# Patient Record
Sex: Female | Born: 1950
Health system: Southern US, Community
[De-identification: ages and names within clinical notes are randomized; demographics above are authoritative.]

## PROBLEM LIST (undated history)

## (undated) DIAGNOSIS — J45909 Unspecified asthma, uncomplicated: Secondary | ICD-10-CM

## (undated) DIAGNOSIS — D72829 Elevated white blood cell count, unspecified: Secondary | ICD-10-CM

## (undated) DIAGNOSIS — K760 Fatty (change of) liver, not elsewhere classified: Secondary | ICD-10-CM

## (undated) DIAGNOSIS — T7840XA Allergy, unspecified, initial encounter: Secondary | ICD-10-CM

## (undated) DIAGNOSIS — K449 Diaphragmatic hernia without obstruction or gangrene: Secondary | ICD-10-CM

## (undated) DIAGNOSIS — K219 Gastro-esophageal reflux disease without esophagitis: Secondary | ICD-10-CM

## (undated) DIAGNOSIS — K5790 Diverticulosis of intestine, part unspecified, without perforation or abscess without bleeding: Secondary | ICD-10-CM

## (undated) DIAGNOSIS — Z8669 Personal history of other diseases of the nervous system and sense organs: Secondary | ICD-10-CM

## (undated) DIAGNOSIS — D649 Anemia, unspecified: Secondary | ICD-10-CM

## (undated) DIAGNOSIS — E785 Hyperlipidemia, unspecified: Secondary | ICD-10-CM

## (undated) DIAGNOSIS — K5900 Constipation, unspecified: Secondary | ICD-10-CM

## (undated) DIAGNOSIS — G43909 Migraine, unspecified, not intractable, without status migrainosus: Secondary | ICD-10-CM

## (undated) DIAGNOSIS — F419 Anxiety disorder, unspecified: Secondary | ICD-10-CM

## (undated) DIAGNOSIS — R5383 Other fatigue: Secondary | ICD-10-CM

## (undated) DIAGNOSIS — M791 Myalgia, unspecified site: Secondary | ICD-10-CM

## (undated) DIAGNOSIS — M1990 Unspecified osteoarthritis, unspecified site: Secondary | ICD-10-CM

## (undated) DIAGNOSIS — M329 Systemic lupus erythematosus, unspecified: Secondary | ICD-10-CM

## (undated) DIAGNOSIS — M317 Microscopic polyangiitis: Secondary | ICD-10-CM

## (undated) DIAGNOSIS — F329 Major depressive disorder, single episode, unspecified: Secondary | ICD-10-CM

## (undated) DIAGNOSIS — I7300 Raynaud's syndrome without gangrene: Secondary | ICD-10-CM

## (undated) DIAGNOSIS — D72820 Lymphocytosis (symptomatic): Secondary | ICD-10-CM

## (undated) DIAGNOSIS — K635 Polyp of colon: Secondary | ICD-10-CM

## (undated) DIAGNOSIS — R1310 Dysphagia, unspecified: Secondary | ICD-10-CM

## (undated) DIAGNOSIS — I10 Essential (primary) hypertension: Secondary | ICD-10-CM

## (undated) DIAGNOSIS — Z889 Allergy status to unspecified drugs, medicaments and biological substances status: Secondary | ICD-10-CM

## (undated) HISTORY — DX: Systemic lupus erythematosus, unspecified: M32.9

## (undated) HISTORY — DX: Essential (primary) hypertension: I10

## (undated) HISTORY — DX: Allergy status to unspecified drugs, medicaments and biological substances: Z88.9

## (undated) HISTORY — DX: Anemia, unspecified: D64.9

## (undated) HISTORY — DX: Myalgia, unspecified site: M79.10

## (undated) HISTORY — DX: Microscopic polyangiitis: M31.7

## (undated) HISTORY — DX: Gastro-esophageal reflux disease without esophagitis: K21.9

## (undated) HISTORY — DX: Allergy, unspecified, initial encounter: T78.40XA

## (undated) HISTORY — DX: Fatty (change of) liver, not elsewhere classified: K76.0

## (undated) HISTORY — DX: Constipation, unspecified: K59.00

## (undated) HISTORY — DX: Lymphocytosis (symptomatic): D72.820

## (undated) HISTORY — DX: Diverticulosis of intestine, part unspecified, without perforation or abscess without bleeding: K57.90

## (undated) HISTORY — DX: Raynaud's syndrome without gangrene: I73.00

## (undated) HISTORY — DX: Major depressive disorder, single episode, unspecified: F32.9

## (undated) HISTORY — DX: Unspecified asthma, uncomplicated: J45.909

## (undated) HISTORY — DX: Diaphragmatic hernia without obstruction or gangrene: K44.9

## (undated) HISTORY — DX: Elevated white blood cell count, unspecified: D72.829

## (undated) HISTORY — PX: LAPAROSCOPIC ABDOMINAL EXPLORATION: SHX6249

## (undated) HISTORY — DX: Other fatigue: R53.83

## (undated) HISTORY — DX: Polyp of colon: K63.5

## (undated) HISTORY — DX: Anxiety disorder, unspecified: F41.9

## (undated) HISTORY — DX: Dysphagia, unspecified: R13.10

## (undated) HISTORY — DX: Personal history of other diseases of the nervous system and sense organs: Z86.69

## (undated) HISTORY — DX: Migraine, unspecified, not intractable, without status migrainosus: G43.909

## (undated) HISTORY — DX: Unspecified osteoarthritis, unspecified site: M19.90

## (undated) HISTORY — DX: Hyperlipidemia, unspecified: E78.5

---
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
## 1987-07-03 HISTORY — PX: APPENDECTOMY: SHX54

## 1987-07-03 HISTORY — PX: ABDOMINAL HYSTERECTOMY: SHX81

## 2012-07-02 HISTORY — PX: COLONOSCOPY: SHX174

## 2013-09-30 DIAGNOSIS — Z8669 Personal history of other diseases of the nervous system and sense organs: Secondary | ICD-10-CM

## 2013-09-30 HISTORY — DX: Personal history of other diseases of the nervous system and sense organs: Z86.69

## 2015-06-15 DIAGNOSIS — M5416 Radiculopathy, lumbar region: Secondary | ICD-10-CM | POA: Insufficient documentation

## 2015-09-27 DIAGNOSIS — L930 Discoid lupus erythematosus: Secondary | ICD-10-CM | POA: Insufficient documentation

## 2015-09-27 DIAGNOSIS — K760 Fatty (change of) liver, not elsewhere classified: Secondary | ICD-10-CM | POA: Insufficient documentation

## 2015-09-27 DIAGNOSIS — G43909 Migraine, unspecified, not intractable, without status migrainosus: Secondary | ICD-10-CM

## 2015-09-27 DIAGNOSIS — K449 Diaphragmatic hernia without obstruction or gangrene: Secondary | ICD-10-CM | POA: Insufficient documentation

## 2015-09-27 HISTORY — DX: Migraine, unspecified, not intractable, without status migrainosus: G43.909

## 2016-07-19 LAB — POCT ERYTHROCYTE SEDIMENTATION RATE, NON-AUTOMATED: SED RATE: 9

## 2016-09-19 ENCOUNTER — Ambulatory Visit: Payer: Self-pay | Admitting: Family Medicine

## 2016-09-25 ENCOUNTER — Encounter: Payer: Self-pay | Admitting: *Deleted

## 2016-09-25 ENCOUNTER — Other Ambulatory Visit: Payer: Self-pay | Admitting: *Deleted

## 2016-10-02 ENCOUNTER — Encounter: Payer: Self-pay | Admitting: Family Medicine

## 2016-10-02 ENCOUNTER — Ambulatory Visit (INDEPENDENT_AMBULATORY_CARE_PROVIDER_SITE_OTHER): Payer: BLUE CROSS/BLUE SHIELD | Admitting: Family Medicine

## 2016-10-02 VITALS — BP 140/87 | HR 79 | Temp 98.5°F | Resp 20 | Ht 62.0 in | Wt 142.2 lb

## 2016-10-02 DIAGNOSIS — I7300 Raynaud's syndrome without gangrene: Secondary | ICD-10-CM | POA: Insufficient documentation

## 2016-10-02 DIAGNOSIS — M329 Systemic lupus erythematosus, unspecified: Secondary | ICD-10-CM

## 2016-10-02 DIAGNOSIS — I10 Essential (primary) hypertension: Secondary | ICD-10-CM | POA: Diagnosis not present

## 2016-10-02 DIAGNOSIS — F332 Major depressive disorder, recurrent severe without psychotic features: Secondary | ICD-10-CM

## 2016-10-02 DIAGNOSIS — M317 Microscopic polyangiitis: Secondary | ICD-10-CM

## 2016-10-02 DIAGNOSIS — F329 Major depressive disorder, single episode, unspecified: Secondary | ICD-10-CM | POA: Insufficient documentation

## 2016-10-02 DIAGNOSIS — F419 Anxiety disorder, unspecified: Secondary | ICD-10-CM | POA: Insufficient documentation

## 2016-10-02 DIAGNOSIS — E785 Hyperlipidemia, unspecified: Secondary | ICD-10-CM | POA: Insufficient documentation

## 2016-10-02 MED ORDER — LISINOPRIL 10 MG PO TABS: 10.0000 mg | ORAL_TABLET | Freq: Every day | ORAL | 1 refills | Status: DC

## 2016-10-02 MED ORDER — TRAZODONE HCL 50 MG PO TABS: 50.0000 mg | ORAL_TABLET | Freq: Every evening | ORAL | 0 refills | Status: DC | PRN

## 2016-10-02 MED ORDER — ESCITALOPRAM OXALATE 10 MG PO TABS: 10.0000 mg | ORAL_TABLET | Freq: Every day | ORAL | 0 refills | Status: DC

## 2016-10-02 NOTE — Patient Instructions (Signed)
It was great to meet you today.  Try to jon the grief counseling group. Take this one day at a time. If you need anything please do not hesitate to make an appt.  Start the trazodone 1 pill about 1 hour before bed for a week. If needed only can go to two pills before bed.  Continue lexapro.  F/U 4 weeks.    Routine follow ups every 6 months on hypertension.

## 2016-10-02 NOTE — Progress Notes (Signed)
Patient ID: Monica Schwartz, female  DOB: 08-May-1951, 66 y.o.   MRN: [id number] Patient Care Team    Relationship Specialty Notifications Start End  Natalia Leatherwood, DO PCP - General Family Medicine  10/02/16   Francee Gentile, MD  Internal Medicine  10/02/16    Comment: rheumatologist- SLE    Subjective:  Monica Schwartz is a 66 y.o.  female present for new patient establishment. All past medical history, surgical history, allergies, family history, immunizations, medications and social history were obtained in the electronic medical record today. All recent labs, ED visits and hospitalizations within the last year were reviewed.  Hypertension: Patient reports compliance with lisinopril 10 mg daily. She denies chest pain, shortness of breath, lower extremity edema or dizziness. She reports normal blood pressures recorded at home. She is in need of refills today. Her lab work is up-to-date and completed every 3 months at her rheumatologist office.  Depression/anxiety: She has been prescribed Lexapro 10 mg daily for quite a few years. She does well typically on this medication. She has recently lost her husband approximately one month ago and is having difficulty adjusting. She is feeling overwhelmed with all the responsibilities and taking care of arrangements since his death. She has contacted hospice and they are guiding her on grief counseling. She has not started grief counseling as of yet but feels she may be ready in a couple weeks to start. She is having difficulty sleeping. She states sometimes she has trouble falling asleep, sometimes she falls sleep but continues to wake up through the night. She has a great support system with her best friend and neighbor. She has one living son that lives in [place] currently.  Systemic lupus erythematous with microscopic polyangiitis angiitis. Patient follows every 3 months with Dr. Herma Carson. She is prescribed tramadol, prednisone 5 mg, methotrexate,  plaque with no Folic acid through her rheumatologist. She has routine laboratory completed every 3 months through that office as well.  No flowsheet data found.    Immunization History  Administered Date(s) Administered  . Influenza Split 04/05/2016  . Tdap 01/25/2014     Past Medical History:  Diagnosis Date  . Allergy   . Anemia   . Anxiety   . Asthma   . Colon polyp    benign  . Constipation   . Diverticulosis   . Dysphagia   . Fatigue   . Fatty liver   . GERD (gastroesophageal reflux disease)   . H/O seasonal allergies   . Hiatal hernia   . History of tremor 09/2013  . Hyperlipidemia   . Hypertension   . Leucocytosis   . Lymphocytosis   . Major depressive disorder    with anxiety  . Microscopic polyangiitis (HCC)   . Migraine   . Myalgia   . Odynophagia   . Osteoarthritis   . Raynauds syndrome   . Systemic lupus (HCC)    + ANA/DS-DNA, anti PR-3ab (neg C-ANCA), anti- cardiolipin IGM. Chronic leukocytosis and mild eosinophilia.    Allergies  Allergen Reactions  . Penicillins Shortness Of Breath and Rash  . Seroquel [Quetiapine Fumarate] Other (See Comments)    Tremor   Past Surgical History:  Procedure Laterality Date  . ABDOMINAL HYSTERECTOMY  1989   with removal of ovaries  . APPENDECTOMY  1989  . CESAREAN SECTION  1984  . LAPAROSCOPIC ABDOMINAL EXPLORATION     x2 for endometrosis 1980 and 1984  . TONSILLECTOMY AND ADENOIDECTOMY  1970  Family History  Problem Relation Age of Onset  . Stroke Mother   . Arthritis Mother   . Depression Mother   . Alcohol abuse Father   . COPD Father   . Hearing loss Father   . Arthritis Brother   . Depression Brother   . Parkinson's disease Brother   . Depression Son   . Breast cancer Paternal Aunt   . Arthritis Paternal Aunt    Social History   Social History  . Marital status: Widowed    Spouse name: N/A  . Number of children: 1  . Years of education: 34   Occupational History  . retired     Social History Main Topics  . Smoking status: Former Smoker    Packs/day: 0.50    Years: 10.00  . Smokeless tobacco: Never Used  . Alcohol use Yes     Comment: occasional  . Drug use: No  . Sexual activity: No   Other Topics Concern  . Not on file   Social History Narrative   Recently widowed March 2018. Has 2 children, one living named [place].   She is retired Sales promotion account executive.   She drinks caffeine. Takes a daily vitamin.   Wears her seatbelt, wears a bicycle helmet.   Routinely exercises.   Smoke detector in the home.   Feels safe in her relationships.   Allergies as of 10/02/2016      Reactions   Penicillins Shortness Of Breath, Rash   Seroquel [quetiapine Fumarate] Other (See Comments)   Tremor      Medication List       Accurate as of 10/02/16  5:24 PM. Always use your most recent med list.          aspirin EC 81 MG tablet Take 81 mg by mouth.   CLARITIN-D 24 HOUR 10-240 MG 24 hr tablet Generic drug:  loratadine-pseudoephedrine Take 1 tablet by mouth daily.   escitalopram 10 MG tablet Commonly known as:  LEXAPRO Take 1 tablet (10 mg total) by mouth daily.   folic acid 1 MG tablet Commonly known as:  FOLVITE Take 1 tablet by mouth daily.   hydroxychloroquine 200 MG tablet Commonly known as:  PLAQUENIL Take 1.5 tablets by mouth daily.   lisinopril 10 MG tablet Commonly known as:  PRINIVIL,ZESTRIL Take 1 tablet (10 mg total) by mouth daily. for blood pressure   methotrexate (PF) 50 MG/2ML injection INJECT 0.5 ML ONCE WEEKLY.   predniSONE 5 MG tablet Commonly known as:  DELTASONE Take 5 mg by mouth daily.   PROAIR HFA IN Inhale into the lungs.   traMADol 50 MG tablet Commonly known as:  ULTRAM Take 50 mg by mouth 3 (three) times daily as needed.   traZODone 50 MG tablet Commonly known as:  DESYREL Take 1-2 tablets (50-100 mg total) by mouth at bedtime as needed for sleep.        No results found for this or any  previous visit (from the past 2160 hour(s)).  Patient was never admitted.   ROS: 14 pt review of systems performed and negative (unless mentioned in an HPI)  Objective: BP 140/87 (BP Location: Left Arm, Patient Position: Sitting, Cuff Size: Normal)   Pulse 79   Temp 98.5 F (36.9 C)   Resp 20   Ht  (1.575 m)   Wt 142 lb 4 oz (64.5 kg)   SpO2 98%   BMI 26.02 kg/m  Gen: Afebrile. No acute distress. Nontoxic in appearance,  well-developed, well-nourished,  very pleasant Caucasian female. HENT: AT. [place]. MMM. Eyes:Pupils Equal Round Reactive to light, Extraocular movements intact,  Conjunctiva without redness, discharge or icterus. Neck/lymp/endocrine: Supple, no lymphadenopathy, no thyromegaly CV: RRR no murmur appreciated, no edema, +2/4 P posterior tibialis pulses. No carotid bruits. No JVD. Chest: CTAB, no wheeze, rhonchi or crackles. Normal Respiratory effort. Good Air movement. Abd: Soft. NTND. BS present. No Masses palpated. No hepatosplenomegaly. No rebound tenderness or guarding. Skin: Warm and well-perfused. Skin intact. Neuro/Msk:Normal gait. PERLA. EOMi. Alert. Oriented x3.   Psych: Tearful, sad. Grieving. Otherwise , Normal affect, dress and demeanor. Normal speech. Normal thought content and judgment. No SI or HI   Assessment/plan: Monica Schwartz is a 66 y.o. female present for establishment of care, worsening depression and chronic medical issues. Essential hypertension - Stable today. - Refills of lisinopril 10 mg daily provided. - Low-sodium diet, routine exercise encouraged. - lisinopril (PRINIVIL,ZESTRIL) 10 MG tablet; Take 1 tablet (10 mg total) by mouth daily. for blood pressure  Dispense: 90 tablet; Refill: 1 - Follow-up in 6 months  Microscopic polyangiitis (HCC)/Systemic lupus erythematosus, unspecified SLE type, unspecified organ involvement status (HCC) - Continue routine follow-up with Dr. Herma Carson every 3 months. Patient will have copy of lab results and to  our office every 3 months.  Severe episode of recurrent major depressive disorder, without psychotic features (HCC) - Worsening. Recently widowed. - Patient appears to have good support system in place. She was encouraged to consider starting grief counseling. -Discussed options for treatment today, decided on starting trazodone 50 mg daily at bedtime for 1 week. If needed only can increase to 100 mg daily at bedtime. Discussed and encouraged good sleep hygiene. - escitalopram (LEXAPRO) 10 MG tablet; Take 1 tablet (10 mg total) by mouth daily.  Dispense: 90 tablet; Refill: 0 - traZODone (DESYREL) 50 MG tablet; Take 1-2 tablets (50-100 mg total) by mouth at bedtime as needed for sleep.  Dispense: 60 tablet; Refill: 0 - Patient to follow-up in 4 weeks   Return in about 4 weeks (around 10/30/2016), or depression.  Greater than 45 minutes was spent with patient, greater than 50% of that time was spent face-to-face with patient counseling and/or coordinating care.    Electronically signed by: Felix Pacini, DO [facility] [place]

## 2016-10-23 ENCOUNTER — Encounter: Payer: Self-pay | Admitting: Family Medicine

## 2016-10-23 ENCOUNTER — Ambulatory Visit (INDEPENDENT_AMBULATORY_CARE_PROVIDER_SITE_OTHER): Payer: BLUE CROSS/BLUE SHIELD | Admitting: Family Medicine

## 2016-10-23 VITALS — BP 126/80 | HR 71 | Temp 98.4°F | Resp 20 | Ht 62.0 in | Wt 141.8 lb

## 2016-10-23 DIAGNOSIS — F332 Major depressive disorder, recurrent severe without psychotic features: Secondary | ICD-10-CM

## 2016-10-23 DIAGNOSIS — F419 Anxiety disorder, unspecified: Secondary | ICD-10-CM

## 2016-10-23 MED ORDER — ESCITALOPRAM OXALATE 20 MG PO TABS: 20.0000 mg | ORAL_TABLET | Freq: Every day | ORAL | 0 refills | Status: DC

## 2016-10-23 MED ORDER — TRAZODONE HCL 50 MG PO TABS: 50.0000 mg | ORAL_TABLET | Freq: Every evening | ORAL | 1 refills | Status: DC | PRN

## 2016-10-23 NOTE — Patient Instructions (Addendum)
It was nice to see you again today.  You look great today. I am glad you doing better. Have fun visiting your son.  Continue the trazodone at night 2 tabs is ok, I have called this in for you.  Continue the lexapro. Start taking 20 mg total (2 tabs of what you have now) the refills will be lexapro 20 mg, so then only take 1 tab. Remember 20 mg total.   Follow up in 5 months for both Hypertension and depression.

## 2016-10-23 NOTE — Progress Notes (Signed)
Patient ID: Monica Schwartz, female  DOB: 03/09/1951, 66 y.o.   MRN: [id number] Patient Care Team    Relationship Specialty Notifications Start End  Natalia Leatherwood, DO PCP - General Family Medicine  10/02/16   Francee Gentile, MD  Internal Medicine  10/02/16    Comment: rheumatologist- SLE   Chief Complaint  Patient presents with  . Depression    Subjective:  Monica Schwartz is a 66 y.o.  female present for follow up depression/anxiety.  Depression/anxiety:  Pt presents today for follow up on depression  after starting trazodone 50 (1-2 tabs) mg Qhs and lexapro 10 mg QD, 4 weeks ago. Patient recently lost her husband suddenly and had a difficult time coping with the loss (see prior note below for details). Pateint reports compliance with lexapro and trazodone 100 mg. Since starting the medicine she feels improved. She has been able to sleep and feels better during the day. She does feel she could use the higher dose of lexapro. She reports no side effects. She is flying out to her son in [place] this week  And is excited to see him.   Prior note 10/02/2016:  She has been prescribed Lexapro 10 mg daily for quite a few years. She does well typically on this medication. She has recently lost her husband approximately one month ago and is having difficulty adjusting. She is feeling overwhelmed with all the responsibilities and taking care of arrangements since his death. She has contacted hospice and they are guiding her on grief counseling. She has not started grief counseling as of yet but feels she may be ready in a couple weeks to start. She is having difficulty sleeping. She states sometimes she has trouble falling asleep, sometimes she falls sleep but continues to wake up through the night. She has a great support system with her best friend and neighbor. She has one living son that lives in [place] currently.   Depression screen [facility] 2/9 10/23/2016  Decreased Interest 3  Down,  Depressed, Hopeless 3  PHQ - 2 Score 6  Altered sleeping 0  Tired, decreased energy 0  Change in appetite 3  Feeling bad or failure about yourself  0  Trouble concentrating 1  Moving slowly or fidgety/restless 0  Suicidal thoughts 0  PHQ-9 Score 10  Difficult doing work/chores Somewhat difficult      Immunization History  Administered Date(s) Administered  . Influenza Split 04/05/2016  . Tdap 01/25/2014     Past Medical History:  Diagnosis Date  . Allergy   . Anemia   . Anxiety   . Asthma   . Colon polyp    benign  . Constipation   . Diverticulosis   . Dysphagia   . Fatigue   . Fatty liver   . GERD (gastroesophageal reflux disease)   . H/O seasonal allergies   . Hiatal hernia   . History of tremor 09/2013  . Hyperlipidemia   . Hypertension   . Leucocytosis   . Lymphocytosis   . Major depressive disorder    with anxiety  . Microscopic polyangiitis (HCC)   . Migraine   . Myalgia   . Odynophagia   . Osteoarthritis   . Raynauds syndrome   . Systemic lupus (HCC)    + ANA/DS-DNA, anti PR-3ab (neg C-ANCA), anti- cardiolipin IGM. Chronic leukocytosis and mild eosinophilia.    Allergies  Allergen Reactions  . Penicillins Shortness Of Breath and Rash  . Seroquel [Quetiapine Fumarate]  Other (See Comments)    Tremor   Past Surgical History:  Procedure Laterality Date  . ABDOMINAL HYSTERECTOMY  1989   with removal of ovaries  . APPENDECTOMY  1989  . CESAREAN SECTION  1984  . LAPAROSCOPIC ABDOMINAL EXPLORATION     x2 for endometrosis 1980 and 1984  . TONSILLECTOMY AND ADENOIDECTOMY  1970   Family History  Problem Relation Age of Onset  . Stroke Mother   . Arthritis Mother   . Depression Mother   . Alcohol abuse Father   . COPD Father   . Hearing loss Father   . Arthritis Brother   . Depression Brother   . Parkinson's disease Brother   . Depression Son   . Breast cancer Paternal Aunt   . Arthritis Paternal Aunt    Social History   Social History    . Marital status: Widowed    Spouse name: N/A  . Number of children: 1  . Years of education: 57   Occupational History  . retired    Social History Main Topics  . Smoking status: Former Smoker    Packs/day: 0.50    Years: 10.00  . Smokeless tobacco: Never Used  . Alcohol use Yes     Comment: occasional  . Drug use: No  . Sexual activity: No   Other Topics Concern  . Not on file   Social History Narrative   Recently widowed March 2018. Has 2 children, one living named [place].   She is retired Sales promotion account executive.   She drinks caffeine. Takes a daily vitamin.   Wears her seatbelt, wears a bicycle helmet.   Routinely exercises.   Smoke detector in the home.   Feels safe in her relationships.   Allergies as of 10/23/2016      Reactions   Penicillins Shortness Of Breath, Rash   Seroquel [quetiapine Fumarate] Other (See Comments)   Tremor      Medication List       Accurate as of 10/23/16 11:37 AM. Always use your most recent med list.          aspirin EC 81 MG tablet Take 81 mg by mouth.   CLARITIN-D 24 HOUR 10-240 MG 24 hr tablet Generic drug:  loratadine-pseudoephedrine Take 1 tablet by mouth daily.   escitalopram 10 MG tablet Commonly known as:  LEXAPRO Take 1 tablet (10 mg total) by mouth daily.   folic acid 1 MG tablet Commonly known as:  FOLVITE Take 1 tablet by mouth daily.   hydroxychloroquine 200 MG tablet Commonly known as:  PLAQUENIL Take 1.5 tablets by mouth daily.   lisinopril 10 MG tablet Commonly known as:  PRINIVIL,ZESTRIL Take 1 tablet (10 mg total) by mouth daily. for blood pressure   methotrexate (PF) 50 MG/2ML injection INJECT 0.5 ML ONCE WEEKLY.   predniSONE 5 MG tablet Commonly known as:  DELTASONE Take 5 mg by mouth daily.   PROAIR HFA IN Inhale into the lungs.   traMADol 50 MG tablet Commonly known as:  ULTRAM Take 50 mg by mouth 3 (three) times daily as needed.   traZODone 50 MG tablet Commonly known  as:  DESYREL Take 1-2 tablets (50-100 mg total) by mouth at bedtime as needed for sleep.        No results found for this or any previous visit (from the past 2160 hour(s)).  Patient was never admitted.   ROS: 14 pt review of systems performed and negative (unless mentioned in an HPI)  Objective: BP 126/80 (BP Location: Right Arm, Patient Position: Sitting, Cuff Size: Normal)   Pulse 71   Temp 98.4 F (36.9 C)   Resp 20   Ht  (1.575 m)   Wt 141 lb 12.8 oz (64.3 kg)   SpO2 99%   BMI 25.94 kg/m   Gen: Afebrile. No acute distress.  HENT: AT. [place].  MMM.  Neuro: Normal gait. PERLA. EOMi. Alert. Oriented.  Psych: Normal affect, dress and demeanor. Normal speech. Normal thought content and judgment. Looks well today.    Assessment/plan: Monica Schwartz is a 66 y.o. female present for follow up on depression.  Severe episode of recurrent major depressive disorder, without psychotic features (HCC) - pt appears much improved today.  - continue trazodone 100 mg and increase lexapro to 20 mg Qd. Refills provided for each.  - Patient appears to have good support system in place. She is excited to go visit her son.  - She was encouraged to consider starting grief counseling. - F/U 5 mos with chronic medical conditions (HTN) follow up, sooner if needed.    Return in about 5 months (around 03/25/2017) for HTN/depression .   Electronically signed by: Felix Pacini, DO [facility] [place]

## 2017-01-21 ENCOUNTER — Other Ambulatory Visit: Payer: Self-pay | Admitting: *Deleted

## 2017-01-21 DIAGNOSIS — F332 Major depressive disorder, recurrent severe without psychotic features: Secondary | ICD-10-CM

## 2017-01-21 DIAGNOSIS — F419 Anxiety disorder, unspecified: Secondary | ICD-10-CM

## 2017-01-21 MED ORDER — ESCITALOPRAM OXALATE 20 MG PO TABS: 20.0000 mg | ORAL_TABLET | Freq: Every day | ORAL | 0 refills | Status: DC

## 2017-01-22 LAB — BASIC METABOLIC PANEL
BUN: 11 (ref 4–21)
Creatinine: 0.7 (ref 0.5–1.1)
Glucose: 84
Potassium: 4.2 (ref 3.4–5.3)
SODIUM: 140 (ref 137–147)

## 2017-01-22 LAB — HEPATIC FUNCTION PANEL
ALK PHOS: 66 (ref 25–125)
ALT: 20 (ref 7–35)
AST: 20 (ref 13–35)
BILIRUBIN, TOTAL: 0.3

## 2017-01-22 LAB — CBC AND DIFFERENTIAL
HCT: 36 (ref 36–46)
Hemoglobin: 12.5 (ref 12.0–16.0)
NEUTROS ABS: 6
Platelets: 296 (ref 150–399)
WBC: 10.1

## 2017-03-22 ENCOUNTER — Ambulatory Visit (INDEPENDENT_AMBULATORY_CARE_PROVIDER_SITE_OTHER): Payer: BLUE CROSS/BLUE SHIELD | Admitting: Family Medicine

## 2017-03-22 ENCOUNTER — Encounter: Payer: Self-pay | Admitting: Family Medicine

## 2017-03-22 VITALS — BP 106/74 | HR 86 | Temp 98.6°F | Resp 20 | Ht 62.0 in | Wt 134.5 lb

## 2017-03-22 DIAGNOSIS — M317 Microscopic polyangiitis: Secondary | ICD-10-CM | POA: Diagnosis not present

## 2017-03-22 DIAGNOSIS — M329 Systemic lupus erythematosus, unspecified: Secondary | ICD-10-CM

## 2017-03-22 DIAGNOSIS — Z23 Encounter for immunization: Secondary | ICD-10-CM

## 2017-03-22 DIAGNOSIS — F419 Anxiety disorder, unspecified: Secondary | ICD-10-CM | POA: Diagnosis not present

## 2017-03-22 DIAGNOSIS — F332 Major depressive disorder, recurrent severe without psychotic features: Secondary | ICD-10-CM

## 2017-03-22 DIAGNOSIS — I10 Essential (primary) hypertension: Secondary | ICD-10-CM | POA: Diagnosis not present

## 2017-03-22 MED ORDER — LISINOPRIL 10 MG PO TABS: 10.0000 mg | ORAL_TABLET | Freq: Every day | ORAL | 1 refills | Status: DC

## 2017-03-22 MED ORDER — ESCITALOPRAM OXALATE 20 MG PO TABS: 20.0000 mg | ORAL_TABLET | Freq: Every day | ORAL | 1 refills | Status: DC

## 2017-03-22 MED ORDER — TRAZODONE HCL 100 MG PO TABS: 100.0000 mg | ORAL_TABLET | Freq: Every evening | ORAL | 1 refills | Status: DC | PRN

## 2017-03-22 NOTE — Progress Notes (Signed)
Patient ID: Monica Schwartz, female  DOB: 06/18/1951, 67 y.o.   MRN: [id number] Patient Care Team    Relationship Specialty Notifications Start End  Natalia Leatherwood, DO PCP - General Family Medicine  10/02/16   Francee Gentile, MD  Internal Medicine  10/02/16    Comment: rheumatologist- SLE   Chief Complaint  Patient presents with  . Hypertension  . Depression    Subjective:  Monica Schwartz is a 66 y.o.  female present for follow up depression/anxiety.  Depression/anxiety:  Pt reports she is doing well on the lexapro 20 mg and Trazodone 100 mg QD. She is sleeping well and feels she is dealing well her grief and depression. She had a wonderful time in [place] visiting her son. She is planning a trip to [place] around the holidays to spend time with her son and his girlfriend.    Initial note 10/02/2016:  She has been prescribed Lexapro 10 mg daily for quite a few years. She does well typically on this medication. She has recently lost her husband approximately one month ago and is having difficulty adjusting. She is feeling overwhelmed with all the responsibilities and taking care of arrangements since his death. She has contacted hospice and they are guiding her on grief counseling. She has not started grief counseling as of yet but feels she may be ready in a couple weeks to start. She is having difficulty sleeping. She states sometimes she has trouble falling asleep, sometimes she falls sleep but continues to wake up through the night. She has a great support system with her best friend and neighbor. She has one living son that lives in [place] currently.   Depression screen [facility] 2/9 03/22/2017 10/23/2016  Decreased Interest 0 3  Down, Depressed, Hopeless 1 3  PHQ - 2 Score 1 6  Altered sleeping 0 0  Tired, decreased energy 1 0  Change in appetite 1 3  Feeling bad or failure about yourself  0 0  Trouble concentrating 1 1  Moving slowly or fidgety/restless 0 0  Suicidal  thoughts 0 0  PHQ-9 Score 4 10  Difficult doing work/chores Somewhat difficult Somewhat difficult     No flowsheet data found.   Immunization History  Administered Date(s) Administered  . Influenza Split 04/05/2016  . Influenza,inj,Quad PF,6+ Mos 03/22/2017  . Tdap 01/25/2014     Past Medical History:  Diagnosis Date  . Allergy   . Anemia   . Anxiety   . Asthma   . Colon polyp    benign  . Constipation   . Diverticulosis   . Dysphagia   . Fatigue   . Fatty liver   . GERD (gastroesophageal reflux disease)   . H/O seasonal allergies   . Hiatal hernia   . History of tremor 09/2013  . Hyperlipidemia   . Hypertension   . Leucocytosis   . Lymphocytosis   . Major depressive disorder    with anxiety  . Microscopic polyangiitis (HCC)   . Migraine   . Myalgia   . Odynophagia   . Osteoarthritis   . Raynauds syndrome   . Systemic lupus (HCC)    + ANA/DS-DNA, anti PR-3ab (neg C-ANCA), anti- cardiolipin IGM. Chronic leukocytosis and mild eosinophilia.    Allergies  Allergen Reactions  . Penicillins Shortness Of Breath and Rash  . Seroquel [Quetiapine Fumarate] Other (See Comments)    Tremor   Past Surgical History:  Procedure Laterality Date  . ABDOMINAL HYSTERECTOMY  1989   with removal of ovaries  . APPENDECTOMY  1989  . CESAREAN SECTION  1984  . LAPAROSCOPIC ABDOMINAL EXPLORATION     x2 for endometrosis 1980 and 1984  . TONSILLECTOMY AND ADENOIDECTOMY  1970   Family History  Problem Relation Age of Onset  . Stroke Mother   . Arthritis Mother   . Depression Mother   . Alcohol abuse Father   . COPD Father   . Hearing loss Father   . Arthritis Brother   . Depression Brother   . Parkinson's disease Brother   . Depression Son   . Breast cancer Paternal Aunt   . Arthritis Paternal Aunt    Social History   Social History  . Marital status: Widowed    Spouse name: N/A  . Number of children: 1  . Years of education: 30   Occupational History  .  retired    Social History Main Topics  . Smoking status: Former Smoker    Packs/day: 0.50    Years: 10.00  . Smokeless tobacco: Never Used  . Alcohol use Yes     Comment: occasional  . Drug use: No  . Sexual activity: No   Other Topics Concern  . Not on file   Social History Narrative   Recently widowed March 2018. Has 2 children, one living named Kirby.   She is retired Sales promotion account executive.   She drinks caffeine. Takes a daily vitamin.   Wears her seatbelt, wears a bicycle helmet.   Routinely exercises.   Smoke detector in the home.   Feels safe in her relationships.   Allergies as of 03/22/2017      Reactions   Penicillins Shortness Of Breath, Rash   Seroquel [quetiapine Fumarate] Other (See Comments)   Tremor      Medication List       Accurate as of 03/22/17 11:49 AM. Always use your most recent med list.          aspirin EC 81 MG tablet Take 81 mg by mouth.   CLARITIN-D 24 HOUR 10-240 MG 24 hr tablet Generic drug:  loratadine-pseudoephedrine Take 1 tablet by mouth daily.   escitalopram 20 MG tablet Commonly known as:  LEXAPRO Take 1 tablet (20 mg total) by mouth daily.   folic acid 1 MG tablet Commonly known as:  FOLVITE Take 1 tablet by mouth daily.   hydroxychloroquine 200 MG tablet Commonly known as:  PLAQUENIL Take 1.5 tablets by mouth daily.   lisinopril 10 MG tablet Commonly known as:  PRINIVIL,ZESTRIL Take 1 tablet (10 mg total) by mouth daily. for blood pressure   methotrexate (PF) 50 MG/2ML injection INJECT 0.5 ML ONCE WEEKLY.   predniSONE 5 MG tablet Commonly known as:  DELTASONE Take 5 mg by mouth daily.   PROAIR HFA IN Inhale into the lungs.   traMADol 50 MG tablet Commonly known as:  ULTRAM Take 50 mg by mouth 3 (three) times daily as needed.   traZODone 100 MG tablet Commonly known as:  DESYREL Take 1 tablet (100 mg total) by mouth at bedtime as needed for sleep.            Discharge Care Instructions          Start     Ordered   03/22/17 0000  Flu Vaccine QUAD 6+ mos PF IM (Fluarix Quad PF)     03/22/17 1116   03/22/17 0000  lisinopril (PRINIVIL,ZESTRIL) 10 MG tablet  Daily  03/22/17 1128   03/22/17 0000  escitalopram (LEXAPRO) 20 MG tablet  Daily     03/22/17 1128   03/22/17 0000  traZODone (DESYREL) 100 MG tablet  At bedtime PRN     03/22/17 1128       No results found for this or any previous visit (from the past 2160 hour(s)).  Patient was never admitted.   ROS: 14 pt review of systems performed and negative (unless mentioned in an HPI)  Objective: BP 106/74 (BP Location: Right Arm, Patient Position: Sitting, Cuff Size: Normal)   Pulse 86   Temp 98.6 F (37 C)   Resp 20   Ht  (1.575 m)   Wt 134 lb 8 oz (61 kg)   SpO2 98%   BMI 24.60 kg/m   Gen: Afebrile. No acute distress. Nontoxic. Very plasant caucasian female.  HENT: AT. [place].  MMM.  Eyes:Pupils Equal Round Reactive to light, Extraocular movements intact,  Conjunctiva without redness, discharge or icterus. CV: RRR no murmur, no edema, +2/4 P posterior tibialis pulses Chest: CTAB, no wheeze or crackles Abd: Soft. NTND. BS present.  Neuro:  Normal gait. PERLA. EOMi. Alert. Oriented.  Psych: Normal affect, dress and demeanor. Normal speech. Normal thought content and judgment.   Assessment/plan: Monica Schwartz is a 66 y.o. female present for follow up on depression.  Influenza vaccine administered - Flu Vaccine QUAD 6+ mos PF IM (Fluarix Quad PF) Essential hypertension - stable. Doing great.  - low sodium diet. Exercise.  - refills on lisinopril 10 mg QD, for 6 months.  - Requested Rheumatologist send routine labs. Would prefer not to duplicate labs, but need to to check CBC and CMP. If labs not received by next visit will need to collect.   Systemic lupus erythematosus, unspecified SLE type, unspecified organ involvement status (HCC) Microscopic polyangiitis (HCC) Followed by  Rheumatology  Severe episode of recurrent major depressive disorder, without psychotic features (HCC) Anxiety - doing rather well on lexapro 20 mg and Trazodone 100 mg QHS. Refills provided today for 6 months  Return in about 6 months (around 09/19/2017) for HTN, Depression/anxiety.   Electronically signed by: Felix Pacini, DO [facility] [place]

## 2017-03-22 NOTE — Patient Instructions (Signed)
It was great to see you today. You look great.  I have refilled your medications for  You.  Please have rheumatologist forward notes so I can see your labs.   Follow in 6 months, unless needed sooner.   Please help [place] help you:  We are honored you have chosen [facility] for your Primary Care home. Below you will find basic instructions that you may need to access in the future. Please help [place] help you by reading the instructions, which cover many of the frequent questions we experience.   Prescription refills and request:  -In order to allow more efficient response time, please call your pharmacy for all refills. They will forward the request electronically to [place]. This allows for the quickest possible response. Request left on a nurse line can take longer to refill, since these are checked as time allows between office patients and other phone calls.  - refill request can take up to 3-5 working days to complete.  - If request is sent electronically and request is appropiate, it is usually completed in 1-2 business days.  - all patients will need to be seen routinely for all chronic medical conditions requiring prescription medications (see follow-up below). If you are overdue for follow up on your condition, you will be asked to make an appointment and we will call in enough medication to cover you until your appointment (up to 30 days).  - all controlled substances will require a face to face visit to request/refill.  - if you desire your prescriptions to go through a new pharmacy, and have an active script at original pharmacy, you will need to call your pharmacy and have scripts transferred to new pharmacy. This is completed between the pharmacy locations and not by your provider.    Results: If any images or labs were ordered, it can take up to 1 week to get results depending on the test ordered and the lab/facility running and resulting the test. - Normal or stable results, which do  not need further discussion, may be released to your mychart immediately with attached note to you. A call may not be generated for normal results. Please make certain to sign up for mychart. If you have questions on how to activate your mychart you can call the front office.  - If your results need further discussion, our office will attempt to contact you via phone, and if unable to reach you after 2 attempts, we will release your abnormal result to your mychart with instructions.  - All results will be automatically released in mychart after 1 week.  - Your provider will provide you with explanation and instruction on all relevant material in your results. Please keep in mind, results and labs may appear confusing or abnormal to the untrained eye, but it does not mean they are actually abnormal for you personally. If you have any questions about your results that are not covered, or you desire more detailed explanation than what was provided, you should make an appointment with your provider to do so.   Our office handles many outgoing and incoming calls daily. If we have not contacted you within 1 week about your results, please check your mychart to see if there is a message first and if not, then contact our office.  In helping with this matter, you help decrease call volume, and therefore allow [place] to be able to respond to patients needs more efficiently.   Acute office visits (sick visit):  An acute visit is intended for a new problem and are scheduled in shorter time slots to allow schedule openings for patients with new problems. This is the appropriate visit to discuss a new problem. In order to provide you with excellent quality medical care with proper time for you to explain your problem, have an exam and receive treatment with instructions, these appointments should be limited to one new problem per visit. If you experience a new problem, in which you desire to be addressed, please make an  acute office visit, we save openings on the schedule to accommodate you. Please do not save your new problem for any other type of visit, let us take care of it properly and quickly for you.   Follow up visits:  Depending on your condition(s) your provider will need to see you routinely in order to provide you with quality care and prescribe medication(s). Most chronic conditions (Example: hypertension, Diabetes, depression/anxiety... etc), require visits a couple times a year. Your provider will instruct you on proper follow up for your personal medical conditions and history. Please make certain to make follow up appointments for your condition as instructed. Failing to do so could result in lapse in your medication treatment/refills. If you request a refill, and are overdue to be seen on a condition, we will always provide you with a 30 day script (once) to allow you time to schedule.    Medicare wellness (well visit): - we have a wonderful Nurse Selena Batten), that will meet with you and provide you will yearly medicare wellness visits. These visits should occur yearly (can not be scheduled less than 1 calendar year apart) and cover preventive health, immunizations, advance directives and screenings you are entitled to yearly through your medicare benefits. Do not miss out on your entitled benefits, this is when medicare will pay for these benefits to be ordered for you.  These are strongly encouraged by your provider and is the appropriate type of visit to make certain you are up to date with all preventive health benefits. If you have not had your medicare wellness exam in the last 12 months, please make certain to schedule one by calling the office and schedule your medicare wellness with Selena Batten as soon as possible.   Yearly physical (well visit):  - Adults are recommended to be seen yearly for physicals. Check with your insurance and date of your last physical, most insurances require one calendar year  between physicals. Physicals include all preventive health topics, screenings, medical exam and labs that are appropriate for gender/age and history. You may have fasting labs needed at this visit. This is a well visit (not a sick visit), new problems should not be covered during this visit (see acute visit).  - Pediatric patients are seen more frequently when they are younger. Your provider will advise you on well child visit timing that is appropriate for your their age. - This is not a medicare wellness visit. Medicare wellness exams do not have an exam portion to the visit. Some medicare companies allow for a physical, some do not allow a yearly physical. If your medicare allows a yearly physical you can schedule the medicare wellness with our nurse Selena Batten and have your physical with your provider after, on the same day. Please check with insurance for your full benefits.   Late Policy/No Shows:  - all new patients should arrive 15-30 minutes earlier than appointment to allow [place] time  to  obtain all personal demographics,  insurance information and for you to complete office paperwork. - All established patients should arrive 10-15 minutes earlier than appointment time to update all information and be checked in .  - In our best efforts to run on time, if you are late for your appointment you will be asked to either reschedule or if able, we will work you back into the schedule. There will be a wait time to work you back in the schedule,  depending on availability.  - If you are unable to make it to your appointment as scheduled, please call 24 hours ahead of time to allow [place] to fill the time slot with someone else who needs to be seen. If you do not cancel your appointment ahead of time, you may be charged a no show fee.

## 2017-03-27 ENCOUNTER — Encounter: Payer: Self-pay | Admitting: Family Medicine

## 2017-03-27 ENCOUNTER — Encounter: Payer: Self-pay | Admitting: *Deleted

## 2017-09-05 DIAGNOSIS — M180 Bilateral primary osteoarthritis of first carpometacarpal joints: Secondary | ICD-10-CM | POA: Diagnosis not present

## 2017-09-05 DIAGNOSIS — Z79899 Other long term (current) drug therapy: Secondary | ICD-10-CM | POA: Diagnosis not present

## 2017-09-05 DIAGNOSIS — M329 Systemic lupus erythematosus, unspecified: Secondary | ICD-10-CM | POA: Diagnosis not present

## 2017-09-05 LAB — BASIC METABOLIC PANEL
BUN: 9 (ref 4–21)
Creatinine: 0.6 (ref 0.5–1.1)
Glucose: 77
Potassium: 4 (ref 3.4–5.3)
Sodium: 141 (ref 137–147)

## 2017-09-05 LAB — CBC AND DIFFERENTIAL
HEMATOCRIT: 37 (ref 36–46)
HEMOGLOBIN: 12.7 (ref 12.0–16.0)
Neutrophils Absolute: 8
PLATELETS: 296 (ref 150–399)
WBC: 11.8

## 2017-09-05 LAB — HM DEXA SCAN

## 2017-09-05 LAB — HEPATIC FUNCTION PANEL
ALK PHOS: 73 (ref 25–125)
ALT: 21 (ref 7–35)
AST: 18 (ref 13–35)
Bilirubin, Total: 0.4

## 2017-09-09 ENCOUNTER — Other Ambulatory Visit: Payer: Self-pay | Admitting: *Deleted

## 2017-09-09 DIAGNOSIS — I10 Essential (primary) hypertension: Secondary | ICD-10-CM

## 2017-09-09 MED ORDER — LISINOPRIL 10 MG PO TABS: 10.0000 mg | ORAL_TABLET | Freq: Every day | ORAL | 0 refills | Status: DC

## 2017-09-11 DIAGNOSIS — M19032 Primary osteoarthritis, left wrist: Secondary | ICD-10-CM | POA: Diagnosis not present

## 2017-09-11 DIAGNOSIS — M180 Bilateral primary osteoarthritis of first carpometacarpal joints: Secondary | ICD-10-CM | POA: Diagnosis not present

## 2017-09-11 DIAGNOSIS — M19031 Primary osteoarthritis, right wrist: Secondary | ICD-10-CM | POA: Diagnosis not present

## 2017-09-11 DIAGNOSIS — M79642 Pain in left hand: Secondary | ICD-10-CM | POA: Diagnosis not present

## 2017-09-11 DIAGNOSIS — M79641 Pain in right hand: Secondary | ICD-10-CM | POA: Diagnosis not present

## 2017-09-17 ENCOUNTER — Ambulatory Visit (INDEPENDENT_AMBULATORY_CARE_PROVIDER_SITE_OTHER): Payer: Medicare Other | Admitting: Family Medicine

## 2017-09-17 ENCOUNTER — Encounter: Payer: Self-pay | Admitting: Family Medicine

## 2017-09-17 VITALS — BP 100/67 | HR 85 | Temp 98.2°F | Ht 62.0 in | Wt 138.0 lb

## 2017-09-17 DIAGNOSIS — F332 Major depressive disorder, recurrent severe without psychotic features: Secondary | ICD-10-CM | POA: Diagnosis not present

## 2017-09-17 DIAGNOSIS — F419 Anxiety disorder, unspecified: Secondary | ICD-10-CM

## 2017-09-17 DIAGNOSIS — M19032 Primary osteoarthritis, left wrist: Secondary | ICD-10-CM | POA: Diagnosis not present

## 2017-09-17 DIAGNOSIS — M317 Microscopic polyangiitis: Secondary | ICD-10-CM | POA: Diagnosis not present

## 2017-09-17 DIAGNOSIS — M180 Bilateral primary osteoarthritis of first carpometacarpal joints: Secondary | ICD-10-CM | POA: Diagnosis not present

## 2017-09-17 DIAGNOSIS — M79641 Pain in right hand: Secondary | ICD-10-CM | POA: Diagnosis not present

## 2017-09-17 DIAGNOSIS — M79642 Pain in left hand: Secondary | ICD-10-CM | POA: Diagnosis not present

## 2017-09-17 DIAGNOSIS — I10 Essential (primary) hypertension: Secondary | ICD-10-CM | POA: Diagnosis not present

## 2017-09-17 DIAGNOSIS — M329 Systemic lupus erythematosus, unspecified: Secondary | ICD-10-CM

## 2017-09-17 DIAGNOSIS — M19031 Primary osteoarthritis, right wrist: Secondary | ICD-10-CM | POA: Diagnosis not present

## 2017-09-17 MED ORDER — ESCITALOPRAM OXALATE 20 MG PO TABS: 20.0000 mg | ORAL_TABLET | Freq: Every day | ORAL | 1 refills | Status: DC

## 2017-09-17 MED ORDER — TRAZODONE HCL 100 MG PO TABS: 100.0000 mg | ORAL_TABLET | Freq: Every evening | ORAL | 1 refills | Status: DC | PRN

## 2017-09-17 MED ORDER — LISINOPRIL 10 MG PO TABS: 10.0000 mg | ORAL_TABLET | Freq: Every day | ORAL | 1 refills | Status: DC

## 2017-09-17 NOTE — Progress Notes (Signed)
Patient ID: Monica AloeCathy G Blumberg, female  DOB: Feb 11, 1951, 67 y.o.   MRN: [id number] Patient Care Team    Relationship Specialty Notifications Start End  Natalia LeatherwoodKuneff, Breuna Loveall A, DO PCP - General Family Medicine  10/02/16   Francee GentileZiolkowska, Aldona, MD  Internal Medicine  10/02/16    Comment: rheumatologist- SLE   Chief Complaint  Patient presents with  . Follow-up    HTN/DEPRESSION/ANXIETY    Subjective:  Monica Schwartz is a 67 y.o.  female present for follow up depression/anxiety.   Essential hypertension Pt reports compliance with lisinopril 10 mg QD. Blood pressures ranges at home 120/70. Patient denies chest pain, shortness of breath or lower extremity edema. Pt takes a daily baby ASA. Pt is not prescribed statin. BMP: 01/22/2017--> WNL CBC: 01/22/2017--> WNL Diet: low sodium Exercise: routinely RF: HTN, HLD, Lupus, FHX stroke  Depression/anxiety:  Pt reports she is still doing well on  lexapro 20 mg and Trazodone 100 mg QD. She is sleeping well and feels she is dealing well her grief and depression.   Initial note 10/02/2016:  She has been prescribed Lexapro 10 mg daily for quite a few years. She does well typically on this medication. She has recently lost her husband approximately one month ago and is having difficulty adjusting. She is feeling overwhelmed with all the responsibilities and taking care of arrangements since his death. She has contacted hospice and they are guiding her on grief counseling. She has not started grief counseling as of yet but feels she may be ready in a couple weeks to start. She is having difficulty sleeping. She states sometimes she has trouble falling asleep, sometimes she falls sleep but continues to wake up through the night. She has a great support system with her best friend and neighbor. She has one living son that lives in [place] currently.   [facility] 09/17/2017 03/22/2017 10/23/2016  Decreased Interest 1 0 3  Down, Depressed, Hopeless 1  1 3   PHQ - 2 Score 2 1 6   Altered sleeping 1 0 0  Tired, decreased energy 1 1 0  Change in appetite 1 1 3   Feeling bad or failure about yourself  1 0 0  Trouble concentrating 1 1 1   Moving slowly or fidgety/restless 2 0 0  Suicidal thoughts 0 0 0  PHQ-9 Score 9 4 10   Difficult doing work/chores - Somewhat difficult Somewhat difficult      GAD 7 : Generalized Anxiety Score 09/17/2017  Nervous, Anxious, on Edge 1  Control/stop worrying 2  Worry too much - different things 2  Trouble relaxing 3  Restless 2  Easily annoyed or irritable 1  Afraid - awful might happen 2  Total GAD 7 Score 13     Immunization History  Administered Date(s) Administered  . Influenza Split 04/05/2016  . Influenza,inj,Quad PF,6+ Mos 03/22/2017  . Pneumococcal Conjugate-13 06/21/2015  . Tdap 01/25/2014     Past Medical History:  Diagnosis Date  . Allergy   . Anemia   . Anxiety   . Asthma   . Colon polyp    benign  . Constipation   . Diverticulosis   . Dysphagia   . Fatigue   . Fatty liver   . GERD (gastroesophageal reflux disease)   . H/O seasonal allergies   . Hiatal hernia   . History of tremor 09/2013  . Hyperlipidemia   . Hypertension   . Leucocytosis   . Lymphocytosis   . Major  depressive disorder    with anxiety  . Microscopic polyangiitis (HCC)   . Migraine   . Myalgia   . Odynophagia   . Osteoarthritis   . Raynauds syndrome   . Systemic lupus (HCC)    + ANA/DS-DNA, anti PR-3ab (neg C-ANCA), anti- cardiolipin IGM. Chronic leukocytosis and mild eosinophilia.    Allergies  Allergen Reactions  . Penicillins Shortness Of Breath and Rash  . Fosamax [Alendronate Sodium]   . Seroquel [Quetiapine Fumarate] Other (See Comments)    Tremor   Past Surgical History:  Procedure Laterality Date  . ABDOMINAL HYSTERECTOMY  1989   with removal of ovaries  . APPENDECTOMY  1989  . CESAREAN SECTION  1984  . COLONOSCOPY  2014  . LAPAROSCOPIC ABDOMINAL EXPLORATION     x2 for  endometrosis 1980 and 1984  . TONSILLECTOMY AND ADENOIDECTOMY  1970   Family History  Problem Relation Age of Onset  . Stroke Mother   . Arthritis Mother   . Depression Mother   . Alcohol abuse Father   . COPD Father   . Hearing loss Father   . Arthritis Brother   . Depression Brother   . Parkinson's disease Brother   . Depression Son   . Breast cancer Paternal Aunt   . Arthritis Paternal Aunt    Social History   Socioeconomic History  . Marital status: Widowed    Spouse name: Not on file  . Number of children: 1  . Years of education: 27  . Highest education level: Not on file  Social Needs  . Financial resource strain: Not on file  . Food insecurity - worry: Not on file  . Food insecurity - inability: Not on file  . Transportation needs - medical: Not on file  . Transportation needs - non-medical: Not on file  Occupational History  . Occupation: retired  Tobacco Use  . Smoking status: Former Smoker    Packs/day: 0.50    Years: 10.00    Pack years: 5.00  . Smokeless tobacco: Never Used  Substance and Sexual Activity  . Alcohol use: Yes    Comment: occasional  . Drug use: No  . Sexual activity: No  Other Topics Concern  . Not on file  Social History Narrative   Recently widowed March 2018. Has 2 children, one living named Miranda.   She is retired Sales promotion account executive.   She drinks caffeine. Takes a daily vitamin.   Wears her seatbelt, wears a bicycle helmet.   Routinely exercises.   Smoke detector in the home.   Feels safe in her relationships.   Allergies as of 09/17/2017      Reactions   Penicillins Shortness Of Breath, Rash   Fosamax [alendronate Sodium]    Seroquel [quetiapine Fumarate] Other (See Comments)   Tremor      Medication List        Accurate as of 09/17/17 11:31 AM. Always use your most recent med list.          aspirin EC 81 MG tablet Take 81 mg by mouth.   CLARITIN-D 24 HOUR 10-240 MG 24 hr tablet Generic drug:   loratadine-pseudoephedrine Take 1 tablet by mouth daily.   escitalopram 20 MG tablet Commonly known as:  LEXAPRO Take 1 tablet (20 mg total) by mouth daily.   folic acid 1 MG tablet Commonly known as:  FOLVITE Take 1 tablet by mouth daily.   hydroxychloroquine 200 MG tablet Commonly known as:  PLAQUENIL Take 1.5  tablets by mouth daily.   lisinopril 10 MG tablet Commonly known as:  PRINIVIL,ZESTRIL Take 1 tablet (10 mg total) by mouth daily. for blood pressure needs office visit prior to anymore refills.   methotrexate (PF) 50 MG/2ML injection INJECT 0.5 ML ONCE WEEKLY.   predniSONE 5 MG tablet Commonly known as:  DELTASONE Take 5 mg by mouth daily.   PROAIR HFA IN Inhale into the lungs.   traMADol 50 MG tablet Commonly known as:  ULTRAM Take 50 mg by mouth 3 (three) times daily as needed.   traZODone 100 MG tablet Commonly known as:  DESYREL Take 1 tablet (100 mg total) by mouth at bedtime as needed for sleep.        No results found for this or any previous visit (from the past 2160 hour(s)).  Patient was never admitted.   ROS: 14 pt review of systems performed and negative (unless mentioned in an HPI)  Objective: BP 100/67 (BP Location: Left Arm, Patient Position: Sitting, Cuff Size: Normal)   Pulse 85   Temp 98.2 F (36.8 C) (Oral)   Ht 5\' 2"  (1.575 m)   Wt 138 lb (62.6 kg)   SpO2 99%   BMI 25.24 kg/m   Gen: Afebrile. No acute distress. Nontoxic in presentation. Well developed, well nourished, pleasant caucasian female.  HENT: AT. [place].  MMM.  Eyes:Pupils Equal Round Reactive to light, Extraocular movements intact,  Conjunctiva without redness, discharge or icterus. Neck/lymp/endocrine: Supple,no lymphadenopathy, no thyromegaly CV: RRR no murmur, no edema, +2/4 P posterior tibialis pulses Chest: CTAB, no wheeze or crackles Neuro:  Normal gait. PERLA. EOMi. Alert. Oriented X3  Psych: Normal affect, dress and demeanor. Normal speech. Normal thought  content and judgment.  Assessment/plan: Monica Schwartz is a 67 y.o. female present for follow up on depression.  Essential hypertension - Stable.  - continue ASA 81 - low sodium diet. Exercise.  - Continue on lisinopril 10 mg QD, for 6 months.  - Requested Rheumatologist send routine labs. Would prefer not to duplicate labs, but need to to check CBC and CMP. If labs not received by next visit will need to collect. Pt reports again having labs, need records.  - f/u 6 months.   Systemic lupus erythematosus, unspecified SLE type, unspecified organ involvement status (HCC) Microscopic polyangiitis (HCC) Followed by Rheumatology, continue  Severe episode of recurrent major depressive disorder, without psychotic features (HCC) Anxiety - "feels better"- continue  lexapro 20 mg and Trazodone 100 mg QHS.  - PHQ and GAD completed today wit elevated reading, however pt states seh does feel better.  - continue current regimen. Refills provided today  - F/U 6 months  Return in about 6 months (around 03/20/2018).   Electronically signed by: Felix Pacini, DO [facility]

## 2017-09-17 NOTE — Patient Instructions (Signed)
It was very nice to see you today. As long as you continue to do well, followup in 6 months. Sooner if needed.  I have refilled you medications.

## 2017-09-25 ENCOUNTER — Encounter: Payer: Self-pay | Admitting: Family Medicine

## 2017-11-26 ENCOUNTER — Encounter: Payer: Self-pay | Admitting: Family Medicine

## 2017-11-26 ENCOUNTER — Ambulatory Visit (HOSPITAL_BASED_OUTPATIENT_CLINIC_OR_DEPARTMENT_OTHER)
Admission: RE | Admit: 2017-11-26 | Discharge: 2017-11-26 | Disposition: A | Discharge status: Home / Self Care | Payer: Medicare Other | Source: Ambulatory Visit | Attending: Family Medicine | Admitting: Family Medicine

## 2017-11-26 ENCOUNTER — Ambulatory Visit (INDEPENDENT_AMBULATORY_CARE_PROVIDER_SITE_OTHER): Payer: Medicare Other | Admitting: Family Medicine

## 2017-11-26 VITALS — BP 117/80 | HR 85 | Temp 98.3°F | Resp 20 | Ht 62.0 in | Wt 139.0 lb

## 2017-11-26 DIAGNOSIS — R0781 Pleurodynia: Secondary | ICD-10-CM | POA: Insufficient documentation

## 2017-11-26 DIAGNOSIS — W19XXXA Unspecified fall, initial encounter: Secondary | ICD-10-CM | POA: Insufficient documentation

## 2017-11-26 DIAGNOSIS — R05 Cough: Secondary | ICD-10-CM | POA: Diagnosis not present

## 2017-11-26 DIAGNOSIS — S299XXA Unspecified injury of thorax, initial encounter: Secondary | ICD-10-CM | POA: Diagnosis not present

## 2017-11-26 DIAGNOSIS — R059 Cough, unspecified: Secondary | ICD-10-CM

## 2017-11-26 MED ORDER — HYDROCODONE-ACETAMINOPHEN 5-325 MG PO TABS: 1.0000 | ORAL_TABLET | Freq: Three times a day (TID) | ORAL | 0 refills | Status: DC | PRN

## 2017-11-26 NOTE — Patient Instructions (Signed)
please have xray completed today.  We will call you with results.  advil  between doses of norco. Norco for severe pain only, take as least as possible but can take every 8 hours if needed. No refills. Must be seen face to face if still in pain in 7 days. Make sure to take deep breaths. Watch for signs of pneumonia.    Rib Fracture A rib fracture is a break or crack in one of the bones of the ribs. The ribs are like a cage that goes around your upper chest. A broken or cracked rib is often painful, but most do not cause other problems. Most rib fractures heal on their own in 1-3 months. Follow these instructions at home:  Avoid activities that cause pain to the injured area. Protect your injured area.  Slowly increase activity as told by your doctor.  Take medicine as told by your doctor.  Put ice on the injured area for the first 1-2 days after you have been treated or as told by your doctor. ? Put ice in a plastic bag. ? Place a towel between your skin and the bag. ? Leave the ice on for 15-20 minutes at a time, every 2 hours while you are awake.  Do deep breathing as told by your doctor. You may be told to: ? Take deep breaths many times a day. ? Cough many times a day while hugging a pillow. ? Use a device (incentive spirometer) to perform deep breathing many times a day.  Drink enough fluids to keep your pee (urine) clear or pale yellow.  Do not wear a rib belt or binder. These do not allow you to breathe deeply. Get help right away if:  You have a fever.  You have trouble breathing.  You cannot stop coughing.  You cough up thick or bloody spit (mucus).  You feel sick to your stomach (nauseous), throw up (vomit), or have belly (abdominal) pain.  Your pain gets worse and medicine does not help. This information is not intended to replace advice given to you by your health care provider. Make sure you discuss any questions you have with your health care  provider. Document Released: 03/27/2008 Document Revised: 11/24/2015 Document Reviewed: 08/20/2012 Elsevier Interactive Patient Education  [employer].

## 2017-11-26 NOTE — Progress Notes (Signed)
Monica Schwartz , [birth year], 67 y.o., female MRN: [id number] Patient Care Team    Relationship Specialty Notifications Start End  Natalia Leatherwood, DO PCP - General Family Medicine  10/02/16   Francee Gentile, MD  Internal Medicine  10/02/16    Comment: rheumatologist- SLE    Chief Complaint  Patient presents with  . Fall    2 weeks ago left rib pain     Subjective: Pt presents for an OV with complaints of left lower rib pain anterior/lateral and posterior after a fall down the hill. She slid down a wet muddy hill and landed on rocks 2 weeks ago. Since that time she has pain in her left side. She has lupus and on chronic steroids and immune modulators. She reports she was taking her tramadol for pain, but it was not helping enough. She has developed a cough over the last 2 days. Taking a deep breath is very painful to her.  When she bent over at the grocery store a few days ago she felt 'crackling" in the left anterior rib cage and the pain has been worse since then.  She denies fever, chills, nausea or vomit.  Depression screen [facility] 2/9 09/17/2017 03/22/2017 10/23/2016  Decreased Interest 1 0 3  Down, Depressed, Hopeless PHQ - 2 Score Altered sleeping 1 0 0  Tired, decreased energy 1 1 0  Change in appetite Feeling bad or failure about yourself  1 0 0  Trouble concentrating Moving slowly or fidgety/restless 2 0 0  Suicidal thoughts 0 0 0  PHQ-9 Score Difficult doing work/chores Somewhat difficult Somewhat difficult Somewhat difficult    Allergies  Allergen Reactions  . Penicillins Shortness Of Breath and Rash  . Fosamax [Alendronate Sodium]   . Quetiapine Other (See Comments)    Tremor  . Seroquel [Quetiapine Fumarate] Other (See Comments)    Tremor   Social History   Tobacco Use  . Smoking status: Former Smoker    Packs/day: 0.50    Years: 10.00    Pack years: 5.00  . Smokeless tobacco: Never Used  Substance Use Topics  . Alcohol  use: Yes    Comment: occasional   Past Medical History:  Diagnosis Date  . Allergy   . Anemia   . Anxiety   . Asthma   . Colon polyp    benign  . Constipation   . Diverticulosis   . Dysphagia   . Fatigue   . Fatty liver   . GERD (gastroesophageal reflux disease)   . H/O seasonal allergies   . Hiatal hernia   . History of tremor 09/2013  . Hyperlipidemia   . Hypertension   . Leucocytosis   . Lymphocytosis   . Major depressive disorder    with anxiety  . Microscopic polyangiitis (HCC)   . Migraine   . Myalgia   . Odynophagia   . Osteoarthritis   . Raynauds syndrome   . Systemic lupus (HCC)    + ANA/DS-DNA, anti PR-3ab (neg C-ANCA), anti- cardiolipin IGM. Chronic leukocytosis and mild eosinophilia.    Past Surgical History:  Procedure Laterality Date  . ABDOMINAL HYSTERECTOMY  1989   with removal of ovaries  . APPENDECTOMY  1989  . CESAREAN SECTION  1984  . COLONOSCOPY  2014  . LAPAROSCOPIC ABDOMINAL EXPLORATION     x2 for endometrosis 1980 and 1984  .  TONSILLECTOMY AND ADENOIDECTOMY  1970   Family History  Problem Relation Age of Onset  . Stroke Mother   . Arthritis Mother   . Depression Mother   . Alcohol abuse Father   . COPD Father   . Hearing loss Father   . Arthritis Brother   . Depression Brother   . Parkinson's disease Brother   . Depression Son   . Breast cancer Paternal Aunt   . Arthritis Paternal Aunt    Allergies as of 11/26/2017      Reactions   Penicillins Shortness Of Breath, Rash   Fosamax [alendronate Sodium]    Quetiapine Other (See Comments)   Tremor   Seroquel [quetiapine Fumarate] Other (See Comments)   Tremor      Medication List        Accurate as of 11/26/17  2:31 PM. Always use your most recent med list.          aspirin EC 81 MG tablet Take 81 mg by mouth.   CLARITIN-D 24 HOUR 10-240 MG 24 hr tablet Generic drug:  loratadine-pseudoephedrine Take 1 tablet by mouth daily.   escitalopram 20 MG tablet Commonly  known as:  LEXAPRO Take 1 tablet (20 mg total) by mouth daily.   folic acid 1 MG tablet Commonly known as:  FOLVITE Take 1 tablet by mouth daily.   hydroxychloroquine 200 MG tablet Commonly known as:  PLAQUENIL Take 1.5 tablets by mouth daily.   lisinopril 10 MG tablet Commonly known as:  PRINIVIL,ZESTRIL Take 1 tablet (10 mg total) by mouth daily. for blood pressure needs office visit prior to anymore refills.   methotrexate (PF) 50 MG/2ML injection INJECT 0.5 ML ONCE WEEKLY.   predniSONE 5 MG tablet Commonly known as:  DELTASONE Take 5 mg by mouth daily.   PROAIR HFA IN Inhale into the lungs.   traMADol 50 MG tablet Commonly known as:  ULTRAM Take 50 mg by mouth 3 (three) times daily as needed.   traZODone 100 MG tablet Commonly known as:  DESYREL Take 1 tablet (100 mg total) by mouth at bedtime as needed for sleep.   UNABLE TO FIND Apply 1 application topically 2 (two) times daily as needed. C-inflammatory topical       All past medical history, surgical history, allergies, family history, immunizations andmedications were updated in the EMR today and reviewed under the history and medication portions of their EMR.     ROS: Negative, with the exception of above mentioned in HPI   Objective:  BP 117/80 (BP Location: Left Arm, Patient Position: Sitting, Cuff Size: Normal)   Pulse 85   Temp 98.3 F (36.8 C)   Resp 20   Ht  (1.575 m)   Wt 139 lb (63 kg)   SpO2 98%   BMI 25.42 kg/m  Body mass index is 25.42 kg/m. Gen: Afebrile. No acute distress. Nontoxic in appearance, well developed, well nourished.  HENT: AT. [place]. MMM, Eyes:Pupils Equal Round Reactive to light, Extraocular movements intact,  Conjunctiva without redness, discharge or icterus. CV: RRR  Chest: CTAB, no wheeze or crackles. Good air movement, bracing lower left rib cage with inspiration.   Abd: Soft. NTND. BS present.  MSK: no bruising. TTP left lower rib cage anteriorly and laterally.  Floating ribs TTP left. Mild TTP left thoracic.  Skin: no rashes, purpura or petechiae.  Neuro:  Normal gait, mildly guarded.  PERLA. EOMi. Alert. Oriented x3   No exam data present No results found. No  results found for this or any previous visit (from the past 24 hour(s)).  Assessment/Plan: Monica Schwartz is a 67 y.o. female present for OV for  Cough Pt encouraged to take deep inspirations every 15 min. Monitor for signs of pneumonia.  - DG Ribs Unilateral Left; Future - DG Chest 2 View; Future  Fall, initial encounter/rib pain - concern for fracture of ribs. Xray and CXR ordered to r/o fx and PNA with new cough.  - [facility] database reviewed. No pain contract with Rheumatology per pt (receiving routine tramadol).  - Norco TID PRN for acute pain. Ibuprofen between doses. 7 day course only.  - consider stool softener with use.  - DG Ribs Unilateral Left; Future - DG Chest 2 View; Future - f/u dependent on xray.     Reviewed expectations re: course of current medical issues.  Discussed self-management of symptoms.  Outlined signs and symptoms indicating need for more acute intervention.  Patient verbalized understanding and all questions were answered.  Patient received an After-Visit Summary.    No orders of the defined types were placed in this encounter.    Note is dictated utilizing voice recognition software. Although note has been proof read prior to signing, occasional typographical errors still can be missed. If any questions arise, please do not hesitate to call for verification.   electronically signed by:  Felix Pacini, DO  [facility] [place]

## 2017-11-27 ENCOUNTER — Telehealth: Payer: Self-pay | Admitting: *Deleted

## 2017-11-27 NOTE — Telephone Encounter (Signed)
Spoke with patient reviewed xray results ,instructions and information. Patient verbalized understanding. 

## 2017-12-25 DIAGNOSIS — M180 Bilateral primary osteoarthritis of first carpometacarpal joints: Secondary | ICD-10-CM | POA: Diagnosis not present

## 2018-03-06 DIAGNOSIS — Z79899 Other long term (current) drug therapy: Secondary | ICD-10-CM | POA: Diagnosis not present

## 2018-03-06 DIAGNOSIS — M329 Systemic lupus erythematosus, unspecified: Secondary | ICD-10-CM | POA: Diagnosis not present

## 2018-03-20 ENCOUNTER — Ambulatory Visit (INDEPENDENT_AMBULATORY_CARE_PROVIDER_SITE_OTHER): Payer: Medicare Other | Admitting: Family Medicine

## 2018-03-20 ENCOUNTER — Encounter: Payer: Self-pay | Admitting: Family Medicine

## 2018-03-20 VITALS — BP 95/67 | HR 96 | Temp 99.0°F | Resp 20 | Ht 62.0 in | Wt 142.0 lb

## 2018-03-20 DIAGNOSIS — M329 Systemic lupus erythematosus, unspecified: Secondary | ICD-10-CM

## 2018-03-20 DIAGNOSIS — Z23 Encounter for immunization: Secondary | ICD-10-CM | POA: Diagnosis not present

## 2018-03-20 DIAGNOSIS — F419 Anxiety disorder, unspecified: Secondary | ICD-10-CM | POA: Diagnosis not present

## 2018-03-20 DIAGNOSIS — F332 Major depressive disorder, recurrent severe without psychotic features: Secondary | ICD-10-CM | POA: Diagnosis not present

## 2018-03-20 DIAGNOSIS — I10 Essential (primary) hypertension: Secondary | ICD-10-CM

## 2018-03-20 DIAGNOSIS — E785 Hyperlipidemia, unspecified: Secondary | ICD-10-CM

## 2018-03-20 MED ORDER — LISINOPRIL 5 MG PO TABS: 5.0000 mg | ORAL_TABLET | Freq: Every day | ORAL | 1 refills | Status: DC

## 2018-03-20 MED ORDER — TRAZODONE HCL 100 MG PO TABS: 100.0000 mg | ORAL_TABLET | Freq: Every evening | ORAL | 1 refills | Status: DC | PRN

## 2018-03-20 MED ORDER — HYDROCODONE-ACETAMINOPHEN 5-325 MG PO TABS: 1.0000 | ORAL_TABLET | Freq: Three times a day (TID) | ORAL | 0 refills | Status: AC | PRN

## 2018-03-20 MED ORDER — ESCITALOPRAM OXALATE 20 MG PO TABS: 20.0000 mg | ORAL_TABLET | Freq: Every day | ORAL | 1 refills | Status: DC

## 2018-03-20 NOTE — Progress Notes (Signed)
Patient ID: Monica Schwartz, female  DOB: Aug 22, 1950, 67 y.o.   MRN: [id number] Patient Care Team    Relationship Specialty Notifications Start End  Natalia Leatherwood, DO PCP - General Family Medicine  10/02/16   Francee Gentile, MD  Internal Medicine  10/02/16    Comment: rheumatologist- SLE   Chief Complaint  Patient presents with  . Hypertension  . Depression  . Anxiety    Subjective:  Monica Schwartz is a 67 y.o.  female present for follow up depression/anxiety/HTN.   Essential hypertension/HLD:  Pt reports compliance with lisinopril 10 mg QD. Blood pressures ranges at home are not routinely checked. Patient denies chest pain, shortness of breath, dizziness or lower extremity edema.. Pt takes a daily baby ASA. Pt is not prescribed statin. BMP: 09/05/2017 WNL CBC: 09/15/2017 WNL Diet: low sodium Exercise: routinely RF: HTN, HLD, Lupus, FHX stroke  Depression/anxiety:  Pt reports she is still doing well on  lexapro 20 mg and Trazodone 100 mg QD. She is sleeping well and feels she is dealing well her grief and depression. She is moving to [place]. She states the house is just to much of reminder of her husbands death (he died in the home).    Systemic lupus erythematosus, unspecified SLE type, unspecified organ involvement status (HCC) Followed by rheumatology. She reports compliance with tramadol TID PRN for pain, supplied by her rheumatologist. She is also prescribed MTX, plaquenil and prednisone. She is having increased pain with the moving process of packing and moving boxes.   Depression screen [facility] 2/9 03/20/2018 09/17/2017 03/22/2017 10/23/2016  Decreased Interest - 1 0 3  Down, Depressed, Hopeless 0 1 1 3   PHQ - 2 Score 0 2 1 6   Altered sleeping 0 1 0 0  Tired, decreased energy 0 1 1 0  Change in appetite 0 1 1 3   Feeling bad or failure about yourself  0 1 0 0  Trouble concentrating 0 1 1 1   Moving slowly or fidgety/restless 0 2 0 0  Suicidal thoughts 0 0 0 0    PHQ-9 Score 0 9 4 10   Difficult doing work/chores Not difficult at all Somewhat difficult Somewhat difficult Somewhat difficult      GAD 7 : Generalized Anxiety Score 03/20/2018 03/20/2018 09/17/2017  Nervous, Anxious, on Edge - 0 1  Control/stop worrying - 0 2  Worry too much - different things - 0 2  Trouble relaxing - 0 3  Restless - 0 2  Easily annoyed or irritable - 0 1  Afraid - awful might happen 0 - 2  Total GAD 7 Score - - 13  Anxiety Difficulty Not difficult at all Not difficult at all -     Immunization History  Administered Date(s) Administered  . Influenza Split 04/05/2016  . Influenza, High Dose Seasonal PF 03/20/2018  . Influenza,inj,Quad PF,6+ Mos 03/22/2017  . Pneumococcal Conjugate-13 06/21/2015  . Tdap 01/25/2014     Past Medical History:  Diagnosis Date  . Allergy   . Anemia   . Anxiety   . Asthma   . Colon polyp    benign  . Constipation   . Diverticulosis   . Dysphagia   . Fatigue   . Fatty liver   . GERD (gastroesophageal reflux disease)   . H/O seasonal allergies   . Hiatal hernia   . History of tremor 09/2013  . Hyperlipidemia   . Hypertension   . Leucocytosis   . Lymphocytosis   .  Major depressive disorder    with anxiety  . Microscopic polyangiitis (HCC)   . Migraine   . Myalgia   . Odynophagia   . Osteoarthritis   . Raynauds syndrome   . Systemic lupus (HCC)    + ANA/DS-DNA, anti PR-3ab (neg C-ANCA), anti- cardiolipin IGM. Chronic leukocytosis and mild eosinophilia.    Allergies  Allergen Reactions  . Penicillins Shortness Of Breath and Rash  . Fosamax [Alendronate Sodium]   . Quetiapine Other (See Comments)    Tremor  . Seroquel [Quetiapine Fumarate] Other (See Comments)    Tremor   Past Surgical History:  Procedure Laterality Date  . ABDOMINAL HYSTERECTOMY  1989   with removal of ovaries  . APPENDECTOMY  1989  . CESAREAN SECTION  1984  . COLONOSCOPY  2014  . LAPAROSCOPIC ABDOMINAL EXPLORATION     x2 for  endometrosis 1980 and 1984  . TONSILLECTOMY AND ADENOIDECTOMY  1970   Family History  Problem Relation Age of Onset  . Stroke Mother   . Arthritis Mother   . Depression Mother   . Alcohol abuse Father   . COPD Father   . Hearing loss Father   . Arthritis Brother   . Depression Brother   . Parkinson's disease Brother   . Depression Son   . Breast cancer Paternal Aunt   . Arthritis Paternal Aunt    Social History   Socioeconomic History  . Marital status: Widowed    Spouse name: Not on file  . Number of children: 1  . Years of education: 30  . Highest education level: Not on file  Occupational History  . Occupation: retired  Engineer, production  . Financial resource strain: Not on file  . Food insecurity:    Worry: Not on file    Inability: Not on file  . Transportation needs:    Medical: Not on file    Non-medical: Not on file  Tobacco Use  . Smoking status: Former Smoker    Packs/day: 0.50    Years: 10.00    Pack years: 5.00  . Smokeless tobacco: Never Used  Substance and Sexual Activity  . Alcohol use: Yes    Comment: occasional  . Drug use: No  . Sexual activity: Never  Lifestyle  . Physical activity:    Days per week: Not on file    Minutes per session: Not on file  . Stress: Not on file  Relationships  . Social connections:    Talks on phone: Not on file    Gets together: Not on file    Attends religious service: Not on file    Active member of club or organization: Not on file    Attends meetings of clubs or organizations: Not on file    Relationship status: Not on file  . Intimate partner violence:    Fear of current or ex partner: Not on file    Emotionally abused: Not on file    Physically abused: Not on file    Forced sexual activity: Not on file  Other Topics Concern  . Not on file  Social History Narrative   Recently widowed March 2018. Has 2 children, one living named Virgin.   She is retired Sales promotion account executive.   She drinks  caffeine. Takes a daily vitamin.   Wears her seatbelt, wears a bicycle helmet.   Routinely exercises.   Smoke detector in the home.   Feels safe in her relationships.   Allergies as of 03/20/2018  Reactions   Penicillins Shortness Of Breath, Rash   Fosamax [alendronate Sodium]    Quetiapine Other (See Comments)   Tremor   Seroquel [quetiapine Fumarate] Other (See Comments)   Tremor      Medication List        Accurate as of 03/20/18 11:58 AM. Always use your most recent med list.          aspirin EC 81 MG tablet Take 81 mg by mouth.   CLARITIN-D 24 HOUR 10-240 MG 24 hr tablet Generic drug:  loratadine-pseudoephedrine Take 1 tablet by mouth daily.   escitalopram 20 MG tablet Commonly known as:  LEXAPRO Take 1 tablet (20 mg total) by mouth daily.   folic acid 1 MG tablet Commonly known as:  FOLVITE Take 1 tablet by mouth daily.   HYDROcodone-acetaminophen 5-325 MG tablet Commonly known as:  NORCO/VICODIN Take 1 tablet by mouth every 8 (eight) hours as needed for up to 7 days for moderate pain.   hydroxychloroquine 200 MG tablet Commonly known as:  PLAQUENIL Take 1.5 tablets by mouth daily.   lisinopril 5 MG tablet Commonly known as:  PRINIVIL,ZESTRIL Take 1 tablet (5 mg total) by mouth daily. for blood pressure needs office visit prior to anymore refills.   methotrexate (PF) 50 MG/2ML injection INJECT 0.5 ML ONCE WEEKLY.   predniSONE 5 MG tablet Commonly known as:  DELTASONE Take 5 mg by mouth daily.   PROAIR HFA IN Inhale into the lungs.   traMADol 50 MG tablet Commonly known as:  ULTRAM Take 50 mg by mouth 3 (three) times daily as needed.   traZODone 100 MG tablet Commonly known as:  DESYREL Take 1 tablet (100 mg total) by mouth at bedtime as needed for sleep.        No results found for this or any previous visit (from the past 2160 hour(s)).  Patient was never admitted.   ROS: 14 pt review of systems performed and negative (unless  mentioned in an HPI)  Objective: BP 95/67 (BP Location: Left Arm, Patient Position: Sitting, Cuff Size: Normal)   Pulse 96   Temp 99 F (37.2 C)   Resp 20   Ht 5\' 2"  (1.575 m)   Wt 142 lb (64.4 kg)   SpO2 98%   BMI 25.97 kg/m   Gen: Afebrile. No acute distress. Nontoxic. Appears well.  HENT: AT. [place].  MMM.  Eyes:Pupils Equal Round Reactive to light, Extraocular movements intact,  Conjunctiva without redness, discharge or icterus. Neck/lymp/endocrine: Supple,no lymphadenopathy, no thyromegaly CV: RRR no murmur, no edema, +2/4 P posterior tibialis pulses Chest: CTAB, no wheeze or crackles Abd: Soft. NTND. BS present.  MSK: no erythema, no joint swelling.  Skin: no rashes, purpura or petechiae.  Neuro:  Normal gait. PERLA. EOMi. Alert. Oriented x3  Psych: Normal affect, dress and demeanor. Normal speech. Normal thought content and judgment.  Assessment/plan: Lavonia DraftsCathy C Coval is a 67 y.o. female present for follow up on depression.  Essential hypertension/HLD - Stable. Decrease lisinopril to 5 mg QD - continue ASA 81 - low sodium diet. Exercise.  - f/u 6 months.   Systemic lupus erythematosus, unspecified SLE type, unspecified organ involvement status (HCC) Followed by Rheumatology, continue Increased pain while moving. Agreed to 1 week norco to use only if increased pain. No refills.  NCCS database reviewed and made part of permanent chart  Severe episode of recurrent major depressive disorder, without psychotic features (HCC)/Anxiety - stable. Continue lexapro 20 mg and Trazodone 100 mg  QHS. Refills provided today.  - F/U 6 months  Immunization due - Flu vaccine HIGH DOSE PF (Fluzone High dose)   Return in about 6 months (around 09/18/2018) for HTN/depression.   Electronically signed by: Felix Pacini, DO [facility] [place]

## 2018-03-20 NOTE — Patient Instructions (Addendum)
Decrease lisinopril to 5 mg a day.  Refills provided on meds.  Short term script for pain med prescribed for flare.   Good luck on your move!  Please help us help you:  We are [facility]. Below you will find basic instructions that you may need to access in the future. Please help us help you by reading the instructions, which cover many of the frequent questions we experience.   Prescription refills and request:  -In order to allow more efficient response time, please call your pharmacy for all refills. They will forward the request electronically to us. This allows for the quickest possible response. Request left on a nurse line can take longer to refill, since these are checked as time allows between office patients and other phone calls.  - refill request can take up to 3-5 working days to complete.  - If request is sent electronically and request is appropiate, it is usually completed in 1-2 business days.  - all patients will need to be seen routinely for all chronic medical conditions requiring prescription medications (see follow-up below). If you are overdue for follow up on your condition, you will be asked to make an appointment and we will call in enough medication to cover you until your appointment (up to 30 days).  - all controlled substances will require a face to face visit to request/refill.  - if you desire your prescriptions to go through a new pharmacy, and have an active script at original pharmacy, you will need to call your pharmacy and have scripts transferred to new pharmacy. This is completed between the pharmacy locations and not by your provider.    Results: If any images or labs were ordered, it can take up to 1 week to get results depending on the test ordered and the lab/facility running and resulting the test. - Normal or stable results, which do not need further discussion, may be released to your mychart  immediately with attached note to you. A call may not be generated for normal results. Please make certain to sign up for mychart. If you have questions on how to activate your mychart you can call the front office.  - If your results need further discussion, our office will attempt to contact you via phone, and if unable to reach you after 2 attempts, we will release your abnormal result to your mychart with instructions.  - All results will be automatically released in mychart after 1 week.  - Your provider will provide you with explanation and instruction on all relevant material in your results. Please keep in mind, results and labs may appear confusing or abnormal to the untrained eye, but it does not mean they are actually abnormal for you personally. If you have any questions about your results that are not covered, or you desire more detailed explanation than what was provided, you should make an appointment with your provider to do so.   Our office handles many outgoing and incoming calls daily. If we have not contacted you within 1 week about your results, please check your mychart to see if there is a message first and if not, then contact our office.  In helping with this matter, you help decrease call volume, and therefore allow us to be able to respond to patients needs more efficiently.   Acute office visits (sick visit):  An acute visit is intended for a new problem and are  scheduled in shorter time slots to allow schedule openings for patients with new problems. This is the appropriate visit to discuss a new problem. Problems will not be addressed by phone call or Echart message. Appointment is needed if requesting treatment. In order to provide you with excellent quality medical care with proper time for you to explain your problem, have an exam and receive treatment with instructions, these appointments should be limited to one new problem per visit. If you experience a new problem, in  which you desire to be addressed, please make an acute office visit, we save openings on the schedule to accommodate you. Please do not save your new problem for any other type of visit, let us take care of it properly and quickly for you.   Follow up visits:  Depending on your condition(s) your provider will need to see you routinely in order to provide you with quality care and prescribe medication(s). Most chronic conditions (Example: hypertension, Diabetes, depression/anxiety... etc), require visits a couple times a year. Your provider will instruct you on proper follow up for your personal medical conditions and history. Please make certain to make follow up appointments for your condition as instructed. Failing to do so could result in lapse in your medication treatment/refills. If you request a refill, and are overdue to be seen on a condition, we will always provide you with a 30 day script (once) to allow you time to schedule.    Medicare wellness (well visit): - we have a wonderful Nurse Maudie Mercury), that will meet with you and provide you will yearly medicare wellness visits. These visits should occur yearly (can not be scheduled less than 1 calendar year apart) and cover preventive health, immunizations, advance directives and screenings you are entitled to yearly through your medicare benefits. Do not miss out on your entitled benefits, this is when medicare will pay for these benefits to be ordered for you.  These are strongly encouraged by your provider and is the appropriate type of visit to make certain you are up to date with all preventive health benefits. If you have not had your medicare wellness exam in the last 12 months, please make certain to schedule one by calling the office and schedule your medicare wellness with Maudie Mercury as soon as possible.   Yearly physical (well visit):  - Adults are recommended to be seen yearly for physicals. Check with your insurance and date of your last physical,  most insurances require one calendar year between physicals. Physicals include all preventive health topics, screenings, medical exam and labs that are appropriate for gender/age and history. You may have fasting labs needed at this visit. This is a well visit (not a sick visit), new problems should not be covered during this visit (see acute visit).  - Pediatric patients are seen more frequently when they are younger. Your provider will advise you on well child visit timing that is appropriate for your their age. - This is not a medicare wellness visit. Medicare wellness exams do not have an exam portion to the visit. Some medicare companies allow for a physical, some do not allow a yearly physical. If your medicare allows a yearly physical you can schedule the medicare wellness with our nurse Maudie Mercury and have your physical with your provider after, on the same day. Please check with insurance for your full benefits.   Late Policy/No Shows:  - all new patients should arrive 15-30 minutes earlier than appointment to allow [place] time  to  obtain all personal demographics,  insurance information and for you to complete office paperwork. - All established patients should arrive 10-15 minutes earlier than appointment time to update all information and be checked in .  - In our best efforts to run on time, if you are late for your appointment you will be asked to either reschedule or if able, we will work you back into the schedule. There will be a wait time to work you back in the schedule,  depending on availability.  - If you are unable to make it to your appointment as scheduled, please call 24 hours ahead of time to allow [place] to fill the time slot with someone else who needs to be seen. If you do not cancel your appointment ahead of time, you may be charged a no show fee.

## 2018-09-04 DIAGNOSIS — Z79899 Other long term (current) drug therapy: Secondary | ICD-10-CM | POA: Diagnosis not present

## 2018-09-04 DIAGNOSIS — M180 Bilateral primary osteoarthritis of first carpometacarpal joints: Secondary | ICD-10-CM | POA: Diagnosis not present

## 2018-09-04 DIAGNOSIS — M329 Systemic lupus erythematosus, unspecified: Secondary | ICD-10-CM | POA: Diagnosis not present

## 2018-09-08 ENCOUNTER — Other Ambulatory Visit: Payer: Self-pay | Admitting: Family Medicine

## 2018-09-08 DIAGNOSIS — I10 Essential (primary) hypertension: Secondary | ICD-10-CM

## 2018-09-13 ENCOUNTER — Other Ambulatory Visit: Payer: Self-pay | Admitting: Family Medicine

## 2018-09-13 DIAGNOSIS — F332 Major depressive disorder, recurrent severe without psychotic features: Secondary | ICD-10-CM

## 2018-09-13 DIAGNOSIS — F419 Anxiety disorder, unspecified: Secondary | ICD-10-CM

## 2018-09-18 ENCOUNTER — Encounter: Payer: Self-pay | Admitting: Family Medicine

## 2018-09-18 ENCOUNTER — Other Ambulatory Visit: Payer: Self-pay

## 2018-09-18 ENCOUNTER — Ambulatory Visit (INDEPENDENT_AMBULATORY_CARE_PROVIDER_SITE_OTHER): Payer: Medicare Other | Admitting: Family Medicine

## 2018-09-18 VITALS — BP 109/76 | HR 83 | Temp 98.1°F | Resp 16 | Ht 62.0 in | Wt 135.1 lb

## 2018-09-18 DIAGNOSIS — F332 Major depressive disorder, recurrent severe without psychotic features: Secondary | ICD-10-CM

## 2018-09-18 DIAGNOSIS — J4520 Mild intermittent asthma, uncomplicated: Secondary | ICD-10-CM

## 2018-09-18 DIAGNOSIS — Z23 Encounter for immunization: Secondary | ICD-10-CM | POA: Diagnosis not present

## 2018-09-18 DIAGNOSIS — M329 Systemic lupus erythematosus, unspecified: Secondary | ICD-10-CM

## 2018-09-18 DIAGNOSIS — I10 Essential (primary) hypertension: Secondary | ICD-10-CM

## 2018-09-18 DIAGNOSIS — F419 Anxiety disorder, unspecified: Secondary | ICD-10-CM | POA: Diagnosis not present

## 2018-09-18 DIAGNOSIS — E785 Hyperlipidemia, unspecified: Secondary | ICD-10-CM | POA: Diagnosis not present

## 2018-09-18 DIAGNOSIS — J45909 Unspecified asthma, uncomplicated: Secondary | ICD-10-CM | POA: Insufficient documentation

## 2018-09-18 LAB — BASIC METABOLIC PANEL
BUN: 17 mg/dL (ref 6–23)
CALCIUM: 9.7 mg/dL (ref 8.4–10.5)
CO2: 28 meq/L (ref 19–32)
CREATININE: 0.68 mg/dL (ref 0.40–1.20)
Chloride: 104 mEq/L (ref 96–112)
GFR: 86.5 mL/min (ref >60.00)
GLUCOSE: 95 mg/dL (ref 70–99)
Potassium: 4.1 mEq/L (ref 3.5–5.1)
Sodium: 138 mEq/L (ref 135–145)

## 2018-09-18 LAB — LIPID PANEL
Cholesterol: 182 mg/dL (ref 0–200)
HDL: 51.7 mg/dL (ref >39.00)
LDL Cholesterol: 97 mg/dL (ref 0–99)
NonHDL: 130.56
TRIGLYCERIDES: 168 mg/dL — AB (ref 0.0–149.0)
Total CHOL/HDL Ratio: 4
VLDL: 33.6 mg/dL (ref 0.0–40.0)

## 2018-09-18 LAB — TSH: TSH: 2.62 u[IU]/mL (ref 0.35–4.50)

## 2018-09-18 MED ORDER — TRAZODONE HCL 100 MG PO TABS: 100.0000 mg | ORAL_TABLET | Freq: Every evening | ORAL | 0 refills | Status: DC | PRN

## 2018-09-18 MED ORDER — LISINOPRIL 5 MG PO TABS: 5.0000 mg | ORAL_TABLET | Freq: Every day | ORAL | 0 refills | Status: DC

## 2018-09-18 MED ORDER — ALBUTEROL SULFATE HFA 108 (90 BASE) MCG/ACT IN AERS: 2.0000 | INHALATION_SPRAY | Freq: Four times a day (QID) | RESPIRATORY_TRACT | 2 refills | Status: DC | PRN

## 2018-09-18 MED ORDER — ESCITALOPRAM OXALATE 20 MG PO TABS: 20.0000 mg | ORAL_TABLET | Freq: Every day | ORAL | 1 refills | Status: DC

## 2018-09-18 NOTE — Patient Instructions (Addendum)
pneumovax vaccine provided today.  Refills on medications provided.  Follow up in 6 months for physical and chronic conditions.   I am glad you are doing well.  Please help [place] help you:  We are honored you have chosen [facility] for your Primary Care home. Below you will find basic instructions that you may need to access in the future. Please help [place] help you by reading the instructions, which cover many of the frequent questions we experience.   Prescription refills and request:  -In order to allow more efficient response time, please call your pharmacy for all refills. They will forward the request electronically to [place]. This allows for the quickest possible response. Request left on a nurse line can take longer to refill, since these are checked as time allows between office patients and other phone calls.  - refill request can take up to 3-5 working days to complete.  - If request is sent electronically and request is appropiate, it is usually completed in 1-2 business days.  - all patients will need to be seen routinely for all chronic medical conditions requiring prescription medications (see follow-up below). If you are overdue for follow up on your condition, you will be asked to make an appointment and we will call in enough medication to cover you until your appointment (up to 30 days).  - all controlled substances will require a face to face visit to request/refill.  - if you desire your prescriptions to go through a new pharmacy, and have an active script at original pharmacy, you will need to call your pharmacy and have scripts transferred to new pharmacy. This is completed between the pharmacy locations and not by your provider.    Results: If any images or labs were ordered, it can take up to 1 week to get results depending on the test ordered and the lab/facility running and resulting the test. - Normal or stable results, which do not need further discussion, may be released  to your mychart immediately with attached note to you. A call may not be generated for normal results. Please make certain to sign up for mychart. If you have questions on how to activate your mychart you can call the front office.  - If your results need further discussion, our office will attempt to contact you via phone, and if unable to reach you after 2 attempts, we will release your abnormal result to your mychart with instructions.  - All results will be automatically released in mychart after 1 week.  - Your provider will provide you with explanation and instruction on all relevant material in your results. Please keep in mind, results and labs may appear confusing or abnormal to the untrained eye, but it does not mean they are actually abnormal for you personally. If you have any questions about your results that are not covered, or you desire more detailed explanation than what was provided, you should make an appointment with your provider to do so.   Our office handles many outgoing and incoming calls daily. If we have not contacted you within 1 week about your results, please check your mychart to see if there is a message first and if not, then contact our office.  In helping with this matter, you help decrease call volume, and therefore allow [place] to be able to respond to patients needs more efficiently.   Acute office visits (sick visit):  An acute visit is intended for a new problem and are  scheduled in shorter time slots to allow schedule openings for patients with new problems. This is the appropriate visit to discuss a new problem. Problems will not be addressed by phone call or Echart message. Appointment is needed if requesting treatment. In order to provide you with excellent quality medical care with proper time for you to explain your problem, have an exam and receive treatment with instructions, these appointments should be limited to one new problem per visit. If you experience a new  problem, in which you desire to be addressed, please make an acute office visit, we save openings on the schedule to accommodate you. Please do not save your new problem for any other type of visit, let us take care of it properly and quickly for you.   Follow up visits:  Depending on your condition(s) your provider will need to see you routinely in order to provide you with quality care and prescribe medication(s). Most chronic conditions (Example: hypertension, Diabetes, depression/anxiety... etc), require visits a couple times a year. Your provider will instruct you on proper follow up for your personal medical conditions and history. Please make certain to make follow up appointments for your condition as instructed. Failing to do so could result in lapse in your medication treatment/refills. If you request a refill, and are overdue to be seen on a condition, we will always provide you with a 30 day script (once) to allow you time to schedule.    Medicare wellness (well visit): - we have a wonderful Nurse Maudie Mercury), that will meet with you and provide you will yearly medicare wellness visits. These visits should occur yearly (can not be scheduled less than 1 calendar year apart) and cover preventive health, immunizations, advance directives and screenings you are entitled to yearly through your medicare benefits. Do not miss out on your entitled benefits, this is when medicare will pay for these benefits to be ordered for you.  These are strongly encouraged by your provider and is the appropriate type of visit to make certain you are up to date with all preventive health benefits. If you have not had your medicare wellness exam in the last 12 months, please make certain to schedule one by calling the office and schedule your medicare wellness with Maudie Mercury as soon as possible.   Yearly physical (well visit):  - Adults are recommended to be seen yearly for physicals. Check with your insurance and date of your  last physical, most insurances require one calendar year between physicals. Physicals include all preventive health topics, screenings, medical exam and labs that are appropriate for gender/age and history. You may have fasting labs needed at this visit. This is a well visit (not a sick visit), new problems should not be covered during this visit (see acute visit).  - Pediatric patients are seen more frequently when they are younger. Your provider will advise you on well child visit timing that is appropriate for your their age. - This is not a medicare wellness visit. Medicare wellness exams do not have an exam portion to the visit. Some medicare companies allow for a physical, some do not allow a yearly physical. If your medicare allows a yearly physical you can schedule the medicare wellness with our nurse Maudie Mercury and have your physical with your provider after, on the same day. Please check with insurance for your full benefits.   Late Policy/No Shows:  - all new patients should arrive 15-30 minutes earlier than appointment to allow [place] time  to  obtain all personal demographics,  insurance information and for you to complete office paperwork. - All established patients should arrive 10-15 minutes earlier than appointment time to update all information and be checked in .  - In our best efforts to run on time, if you are late for your appointment you will be asked to either reschedule or if able, we will work you back into the schedule. There will be a wait time to work you back in the schedule,  depending on availability.  - If you are unable to make it to your appointment as scheduled, please call 24 hours ahead of time to allow [place] to fill the time slot with someone else who needs to be seen. If you do not cancel your appointment ahead of time, you may be charged a no show fee.

## 2018-09-18 NOTE — Progress Notes (Signed)
Patient ID: [name], female  DOB: [birth year], 68 y.o.   MRN: [id number] Patient Care Team    Relationship Specialty Notifications Start End  Natalia Leatherwood, DO PCP - General Family Medicine  10/02/16   Francee Gentile, MD  Internal Medicine  10/02/16    Comment: rheumatologist- SLE   Chief Complaint  Patient presents with   Hypertension    Not fasting. Pt was dx with gout x1-2 weeks ago. Lab work was completed by Rheumotologist 1-2 weeks ago. Pt needs refill on inhaler today    Depression    Subjective:  Monica Schwartz is a 68 y.o.  female present for follow up depression/anxiety/HTN.  Essential hypertension/HLD:  Pt reports compliance with lisinopril 5 mg QD. Blood pressures ranges at home are not routinely checked. Patient denies chest pain, shortness of breath, dizziness or lower extremity edema.   Pt takes a daily baby ASA. Pt is not prescribed statin. BMP: 09/05/2017 WNL CBC: 09/15/2017 WNL--> pt reports she just had collected at her rheumatologist.  TSH: Collected today Lipids: Collected today Diet: low sodium Exercise: routinely RF: HTN, HLD, Lupus, FHX stroke  Depression/anxiety:  Pt reports she is still doing well on  lexapro 20 mg and Trazodone 100 mg QD. She is sleeping well and feels she is dealing well her grief and depression.  She does have some worries with the coronavirus outbreak.  Systemic lupus erythematosus, unspecified SLE type, unspecified organ involvement status (HCC) Followed by rheumatology. She reports compliance with tramadol TID PRN for pain, supplied by her rheumatologist. She is also prescribed MTX, plaquenil and prednisone.   Depression screen [facility] 2/9 09/18/2018 03/20/2018 09/17/2017 03/22/2017 10/23/2016  Decreased Interest 1 - 1 0 3  Down, Depressed, Hopeless 1 0 PHQ - 2 Score 2 0 Altered sleeping 0 0 1 0 0  Tired, decreased energy 1 0 1 1 0  Change in appetite 1 0 Feeling bad or failure about yourself  1 0 1  0 0  Trouble concentrating 0 0 Moving slowly or fidgety/restless 0 0 2 0 0  Suicidal thoughts 0 0 0 0 0  PHQ-9 Score 5 0 Difficult doing work/chores Not difficult at all Not difficult at all Somewhat difficult Somewhat difficult Somewhat difficult      GAD 7 : Generalized Anxiety Score 09/18/2018 03/20/2018 03/20/2018 09/17/2017  Nervous, Anxious, on Edge 1 - 0 1  Control/stop worrying 1 - 0 2  Worry too much - different things 1 - 0 2  Trouble relaxing 1 - 0 3  Restless 1 - 0 2  Easily annoyed or irritable 1 - 0 1  Afraid - awful might happen 1 0 - 2  Total GAD 7 Score 7 - - 13  Anxiety Difficulty Not difficult at all Not difficult at all Not difficult at all -     Immunization History  Administered Date(s) Administered   Influenza Split 04/05/2016   Influenza, High Dose Seasonal PF 03/20/2018   Influenza,inj,Quad PF,6+ Mos 03/22/2017   Pneumococcal Conjugate-13 06/21/2015   Tdap 01/25/2014     Past Medical History:  Diagnosis Date   Allergy    Anemia    Anxiety    Asthma    Colon polyp    benign   Constipation    Diverticulosis    Dysphagia    Fatigue    Fatty liver  GERD (gastroesophageal reflux disease)    H/O seasonal allergies    Hiatal hernia    History of tremor 09/2013   Hyperlipidemia    Hypertension    Leucocytosis    Lymphocytosis    Major depressive disorder    with anxiety   Microscopic polyangiitis (HCC)    Migraine    Myalgia    Odynophagia    Osteoarthritis    Raynauds syndrome    Systemic lupus (HCC)    + ANA/DS-DNA, anti PR-3ab (neg C-ANCA), anti- cardiolipin IGM. Chronic leukocytosis and mild eosinophilia.    Allergies  Allergen Reactions   Penicillins Shortness Of Breath and Rash   Fosamax [Alendronate Sodium]    Quetiapine Other (See Comments)    Tremor   Seroquel [Quetiapine Fumarate] Other (See Comments)    Tremor   Past Surgical History:  Procedure Laterality Date    ABDOMINAL HYSTERECTOMY  1989   with removal of ovaries   APPENDECTOMY  1989   CESAREAN SECTION  1984   COLONOSCOPY  2014   LAPAROSCOPIC ABDOMINAL EXPLORATION     x2 for endometrosis 1980 and 1984   TONSILLECTOMY AND ADENOIDECTOMY  1970   Family History  Problem Relation Age of Onset   Stroke Mother    Arthritis Mother    Depression Mother    Alcohol abuse Father    COPD Father    Hearing loss Father    Arthritis Brother    Depression Brother    Parkinson's disease Brother    Depression Son    Breast cancer Paternal Aunt    Arthritis Paternal Aunt    Social History   Socioeconomic History   Marital status: Widowed    Spouse name: Not on file   Number of children: 1   Years of education: 14   Highest education level: Not on file  Occupational History   Occupation: retired  Ecologist strain: Not on file   Food insecurity:    Worry: Not on file    Inability: Not on Occupational hygienist needs:    Medical: Not on file    Non-medical: Not on file  Tobacco Use   Smoking status: Former Smoker    Packs/day: 0.50    Years: 10.00    Pack years: 5.00   Smokeless tobacco: Never Used  Substance and Sexual Activity   Alcohol use: Yes    Comment: occasional   Drug use: No   Sexual activity: Never  Lifestyle   Physical activity:    Days per week: Not on file    Minutes per session: Not on file   Stress: Not on file  Relationships   Social connections:    Talks on phone: Not on file    Gets together: Not on file    Attends religious service: Not on file    Active member of club or organization: Not on file    Attends meetings of clubs or organizations: Not on file    Relationship status: Not on file   Intimate partner violence:    Fear of current or ex partner: Not on file    Emotionally abused: Not on file    Physically abused: Not on file    Forced sexual activity: Not on file  Other Topics Concern    Not on file  Social History Narrative   Recently widowed March 2018. Has 2 children, one living named Monica Schwartz.   She is retired Sales promotion account executive.  She drinks caffeine. Takes a daily vitamin.   Wears her seatbelt, wears a bicycle helmet.   Routinely exercises.   Smoke detector in the home.   Feels safe in her relationships.   Allergies as of 09/18/2018      Reactions   Penicillins Shortness Of Breath, Rash   Fosamax [alendronate Sodium]    Quetiapine Other (See Comments)   Tremor   Seroquel [quetiapine Fumarate] Other (See Comments)   Tremor      Medication List       Accurate as of September 18, 2018 10:23 AM. Always use your most recent med list.        aspirin EC 81 MG tablet Take 81 mg by mouth.   Claritin-D 24 Hour 10-240 MG 24 hr tablet Generic drug:  loratadine-pseudoephedrine Take 1 tablet by mouth daily.   colchicine 0.6 MG tablet TAKE 1 TABLET (0.6 MG TOTAL) BY MOUTH 2 TIMES DAILY.   escitalopram 20 MG tablet Commonly known as:  LEXAPRO Take 1 tablet (20 mg total) by mouth daily.   folic acid 1 MG tablet Commonly known as:  FOLVITE Take 1 tablet by mouth daily.   hydroxychloroquine 200 MG tablet Commonly known as:  PLAQUENIL Take 1.5 tablets by mouth daily.   lisinopril 5 MG tablet Commonly known as:  PRINIVIL,ZESTRIL Take 1 tablet (5 mg total) by mouth daily.   methotrexate (PF) 50 MG/2ML injection INJECT 0.5 ML ONCE WEEKLY.   predniSONE 5 MG tablet Commonly known as:  DELTASONE Take 5 mg by mouth daily.   PROAIR HFA IN Inhale into the lungs.   traMADol 50 MG tablet Commonly known as:  ULTRAM Take 50 mg by mouth 3 (three) times daily as needed.   traZODone 100 MG tablet Commonly known as:  DESYREL TAKE 1 TABLET (100 MG TOTAL) BY MOUTH AT BEDTIME AS NEEDED FOR SLEEP.   triamcinolone cream 0.1 % Commonly known as:  KENALOG APPLY AND RUB IN A THIN FILM TO AFFECTED AREAS TWICE DAILY.(AM AND PM).        No results found for  this or any previous visit (from the past 2160 hour(s)).  Patient was never admitted.   ROS: 14 pt review of systems performed and negative (unless mentioned in an HPI)  Objective: BP 109/76 (BP Location: Left Arm, Patient Position: Sitting, Cuff Size: Normal)    Pulse 83    Temp 98.1 F (36.7 C) (Oral)    Resp 16    Ht 5\' 2"  (1.575 m)    Wt 135 lb 2 oz (61.3 kg)    SpO2 98%    BMI 24.71 kg/m   Gen: Afebrile. No acute distress.  Nontoxic in presentation, pleasant, Caucasian female. HENT: AT. [place].  MMM.  Eyes:Pupils Equal Round Reactive to light, Extraocular movements intact,  Conjunctiva without redness, discharge or icterus. Neck/lymp/endocrine: Supple,no lymphadenopathy, no thyromegaly CV: RRR no murmur, no edema, +2/4 P posterior tibialis pulses Chest: CTAB, no wheeze or crackles Skin: no rashes, purpura or petechiae.  Neuro:  Normal gait. PERLA. EOMi. Alert. Oriented x3  Psych: Normal affect, dress and demeanor. Normal speech. Normal thought content and judgment.    Assessment/plan: SANAH BURNSIDE is a 68 y.o. female present for follow up on depression.  Essential hypertension/HLD -Stable.  Continue lisinopril to 5 mg QD - continue ASA 81 - low sodium diet. Exercise.  - f/u 6 months (told her to schedule as CPE)  Systemic lupus erythematosus, unspecified SLE type, unspecified organ involvement status (HCC)  Followed by Rheumatology, continue. Prescribed Trmadol, MTX, plaquenil and chronic low dose steroid by Rheumatology.  Severe episode of recurrent major depressive disorder, without psychotic features (HCC)/Anxiety - stable. Continue lexapro 20 mg and Trazodone 100 mg QHS. Refills provided today.  - F/U 6 months  Asthma: well controlled. Refilled albuterol.   Pneumovax23 provided today  Return in about 6 months (around 03/21/2019) for CPE.   Electronically signed by: Felix Pacini, DO [facility] [place]

## 2018-10-02 ENCOUNTER — Other Ambulatory Visit: Payer: Self-pay | Admitting: Family Medicine

## 2018-10-02 DIAGNOSIS — I10 Essential (primary) hypertension: Secondary | ICD-10-CM

## 2018-10-13 ENCOUNTER — Telehealth: Payer: Self-pay

## 2018-10-13 NOTE — Telephone Encounter (Signed)
Pt called and left message on [facility] mailbox returning call to office.

## 2018-10-13 NOTE — Telephone Encounter (Signed)
Called and left detailed message on VM, okay per DPR, letting pt know they are due for their Medicare wellness exam. Pt advised this is to review preventive health issues and was being offered as a virtual visit with MD. Pt was asked to call office to get scheduled.  

## 2018-10-14 NOTE — Telephone Encounter (Signed)
LM for patient to CB to schedule Medicare Wellness exam °

## 2018-10-26 ENCOUNTER — Other Ambulatory Visit: Payer: Self-pay | Admitting: Family Medicine

## 2018-10-26 DIAGNOSIS — I10 Essential (primary) hypertension: Secondary | ICD-10-CM

## 2018-12-05 DIAGNOSIS — M180 Bilateral primary osteoarthritis of first carpometacarpal joints: Secondary | ICD-10-CM | POA: Diagnosis not present

## 2018-12-05 DIAGNOSIS — M25552 Pain in left hip: Secondary | ICD-10-CM | POA: Diagnosis not present

## 2018-12-05 DIAGNOSIS — Z79899 Other long term (current) drug therapy: Secondary | ICD-10-CM | POA: Diagnosis not present

## 2018-12-05 DIAGNOSIS — M329 Systemic lupus erythematosus, unspecified: Secondary | ICD-10-CM | POA: Diagnosis not present

## 2018-12-15 ENCOUNTER — Other Ambulatory Visit: Payer: Self-pay | Admitting: Family Medicine

## 2018-12-15 DIAGNOSIS — F419 Anxiety disorder, unspecified: Secondary | ICD-10-CM

## 2018-12-15 DIAGNOSIS — F332 Major depressive disorder, recurrent severe without psychotic features: Secondary | ICD-10-CM

## 2019-01-22 ENCOUNTER — Other Ambulatory Visit: Payer: Self-pay

## 2019-01-22 DIAGNOSIS — I10 Essential (primary) hypertension: Secondary | ICD-10-CM

## 2019-01-22 MED ORDER — LISINOPRIL 5 MG PO TABS: 5.0000 mg | ORAL_TABLET | Freq: Every day | ORAL | 0 refills | Status: DC

## 2019-01-22 NOTE — Progress Notes (Signed)
[facility] sent fax request refill. Pt has CPE scheduled on 03/2019.

## 2019-03-13 DIAGNOSIS — M329 Systemic lupus erythematosus, unspecified: Secondary | ICD-10-CM | POA: Diagnosis not present

## 2019-03-13 DIAGNOSIS — Z79899 Other long term (current) drug therapy: Secondary | ICD-10-CM | POA: Diagnosis not present

## 2019-03-13 DIAGNOSIS — Z7952 Long term (current) use of systemic steroids: Secondary | ICD-10-CM | POA: Diagnosis not present

## 2019-03-17 ENCOUNTER — Other Ambulatory Visit: Payer: Self-pay | Admitting: Registered"

## 2019-03-17 DIAGNOSIS — Z20822 Contact with and (suspected) exposure to covid-19: Secondary | ICD-10-CM

## 2019-03-17 DIAGNOSIS — R6889 Other general symptoms and signs: Secondary | ICD-10-CM | POA: Diagnosis not present

## 2019-03-19 LAB — NOVEL CORONAVIRUS, NAA: SARS-CoV-2, NAA: NOT DETECTED

## 2019-03-24 ENCOUNTER — Encounter: Payer: Self-pay | Admitting: Family Medicine

## 2019-03-24 ENCOUNTER — Other Ambulatory Visit: Payer: Self-pay

## 2019-03-24 ENCOUNTER — Telehealth: Payer: Self-pay | Admitting: Family Medicine

## 2019-03-24 ENCOUNTER — Ambulatory Visit (INDEPENDENT_AMBULATORY_CARE_PROVIDER_SITE_OTHER): Payer: Medicare Other | Admitting: Family Medicine

## 2019-03-24 VITALS — BP 115/73 | HR 102 | Temp 97.8°F | Resp 18 | Ht 60.5 in | Wt 148.0 lb

## 2019-03-24 DIAGNOSIS — Z23 Encounter for immunization: Secondary | ICD-10-CM | POA: Diagnosis not present

## 2019-03-24 DIAGNOSIS — E785 Hyperlipidemia, unspecified: Secondary | ICD-10-CM | POA: Diagnosis not present

## 2019-03-24 DIAGNOSIS — K760 Fatty (change of) liver, not elsewhere classified: Secondary | ICD-10-CM | POA: Diagnosis not present

## 2019-03-24 DIAGNOSIS — Z0000 Encounter for general adult medical examination without abnormal findings: Secondary | ICD-10-CM

## 2019-03-24 DIAGNOSIS — Z1231 Encounter for screening mammogram for malignant neoplasm of breast: Secondary | ICD-10-CM

## 2019-03-24 DIAGNOSIS — F332 Major depressive disorder, recurrent severe without psychotic features: Secondary | ICD-10-CM

## 2019-03-24 DIAGNOSIS — F419 Anxiety disorder, unspecified: Secondary | ICD-10-CM | POA: Diagnosis not present

## 2019-03-24 DIAGNOSIS — J4520 Mild intermittent asthma, uncomplicated: Secondary | ICD-10-CM | POA: Diagnosis not present

## 2019-03-24 DIAGNOSIS — Z131 Encounter for screening for diabetes mellitus: Secondary | ICD-10-CM

## 2019-03-24 DIAGNOSIS — M329 Systemic lupus erythematosus, unspecified: Secondary | ICD-10-CM

## 2019-03-24 DIAGNOSIS — I10 Essential (primary) hypertension: Secondary | ICD-10-CM

## 2019-03-24 DIAGNOSIS — E663 Overweight: Secondary | ICD-10-CM | POA: Insufficient documentation

## 2019-03-24 LAB — LIPID PANEL
Cholesterol: 211 mg/dL — ABNORMAL HIGH (ref 0–200)
HDL: 57.4 mg/dL (ref >39.00)
LDL Cholesterol: 131 mg/dL — ABNORMAL HIGH (ref 0–99)
NonHDL: 154.02
Total CHOL/HDL Ratio: 4
Triglycerides: 116 mg/dL (ref 0.0–149.0)
VLDL: 23.2 mg/dL (ref 0.0–40.0)

## 2019-03-24 LAB — TSH: TSH: 2.66 u[IU]/mL (ref 0.35–4.50)

## 2019-03-24 LAB — HEMOGLOBIN A1C: Hgb A1c MFr Bld: 6.2 % (ref 4.6–6.5)

## 2019-03-24 MED ORDER — ESCITALOPRAM OXALATE 20 MG PO TABS: 20.0000 mg | ORAL_TABLET | Freq: Every day | ORAL | 1 refills | Status: DC

## 2019-03-24 MED ORDER — ALBUTEROL SULFATE HFA 108 (90 BASE) MCG/ACT IN AERS: 2.0000 | INHALATION_SPRAY | Freq: Four times a day (QID) | RESPIRATORY_TRACT | 11 refills | Status: DC | PRN

## 2019-03-24 MED ORDER — TRAZODONE HCL 100 MG PO TABS: 150.0000 mg | ORAL_TABLET | Freq: Every evening | ORAL | 1 refills | Status: DC | PRN

## 2019-03-24 MED ORDER — PAROXETINE HCL 10 MG PO TABS: 10.0000 mg | ORAL_TABLET | Freq: Every day | ORAL | 1 refills | Status: DC

## 2019-03-24 MED ORDER — ZOSTER VAC RECOMB ADJUVANTED 50 MCG/0.5ML IM SUSR: 0.5000 mL | Freq: Once | INTRAMUSCULAR | 1 refills | Status: AC

## 2019-03-24 MED ORDER — LISINOPRIL 5 MG PO TABS: 5.0000 mg | ORAL_TABLET | Freq: Every day | ORAL | 1 refills | Status: DC

## 2019-03-24 NOTE — Patient Instructions (Addendum)
Health Maintenance After Age 67 After age 67, you are at a higher risk for certain long-term diseases and infections as well as injuries from falls. Falls are a major cause of broken bones and head injuries in people who are older than age 67. Getting regular preventive care can help to keep you healthy and well. Preventive care includes getting regular testing and making lifestyle changes as recommended by your health care provider. Talk with your health care provider about:  Which screenings and tests you should have. A screening is a test that checks for a disease when you have no symptoms.  A diet and exercise plan that is right for you. What should I know about screenings and tests to prevent falls? Screening and testing are the best ways to find a health problem early. Early diagnosis and treatment give you the best chance of managing medical conditions that are common after age 67. Certain conditions and lifestyle choices may make you more likely to have a fall. Your health care provider may recommend:  Regular vision checks. Poor vision and conditions such as cataracts can make you more likely to have a fall. If you wear glasses, make sure to get your prescription updated if your vision changes.  Medicine review. Work with your health care provider to regularly review all of the medicines you are taking, including over-the-counter medicines. Ask your health care provider about any side effects that may make you more likely to have a fall. Tell your health care provider if any medicines that you take make you feel dizzy or sleepy.  Osteoporosis screening. Osteoporosis is a condition that causes the bones to get weaker. This can make the bones weak and cause them to break more easily.  Blood pressure screening. Blood pressure changes and medicines to control blood pressure can make you feel dizzy.  Strength and balance checks. Your health care provider may recommend certain tests to check your  strength and balance while standing, walking, or changing positions.  Foot health exam. Foot pain and numbness, as well as not wearing proper footwear, can make you more likely to have a fall.  Depression screening. You may be more likely to have a fall if you have a fear of falling, feel emotionally low, or feel unable to do activities that you used to do.  Alcohol use screening. Using too much alcohol can affect your balance and may make you more likely to have a fall. What actions can I take to lower my risk of falls? General instructions  Talk with your health care provider about your risks for falling. Tell your health care provider if: ? You fall. Be sure to tell your health care provider about all falls, even ones that seem minor. ? You feel dizzy, sleepy, or off-balance.  Take over-the-counter and prescription medicines only as told by your health care provider. These include any supplements.  Eat a healthy diet and maintain a healthy weight. A healthy diet includes low-fat dairy products, low-fat (lean) meats, and fiber from whole grains, beans, and lots of fruits and vegetables. Home safety  Remove any tripping hazards, such as rugs, cords, and clutter.  Install safety equipment such as grab bars in bathrooms and safety rails on stairs.  Keep rooms and walkways well-lit. Activity   Follow a regular exercise program to stay fit. This will help you maintain your balance. Ask your health care provider what types of exercise are appropriate for you.  If you need a cane or   walker, use it as recommended by your health care provider.  Wear supportive shoes that have nonskid soles. Lifestyle  Do not drink alcohol if your health care provider tells you not to drink.  If you drink alcohol, limit how much you have: ? 0-1 drink a day for women. ? 0-2 drinks a day for men.  Be aware of how much alcohol is in your drink. In the [place] one drink equals one typical bottle of beer (12  oz), one-half glass of wine (5 oz), or one shot of hard liquor (1 oz).  Do not use any products that contain nicotine or tobacco, such as cigarettes and e-cigarettes. If you need help quitting, ask your health care provider. Summary  Having a healthy lifestyle and getting preventive care can help to protect your health and wellness after age 67.  Screening and testing are the best way to find a health problem early and help you avoid having a fall. Early diagnosis and treatment give you the best chance for managing medical conditions that are more common for people who are older than age 67.  Falls are a major cause of broken bones and head injuries in people who are older than age 67. Take precautions to prevent a fall at home.  Work with your health care provider to learn what changes you can make to improve your health and wellness and to prevent falls. This information is not intended to replace advice given to you by your health care provider. Make sure you discuss any questions you have with your health care provider. Document Released: 05/01/2017 Document Revised: 10/09/2018 Document Reviewed: 05/01/2017 Elsevier Patient Education  2020 [employer]  

## 2019-03-24 NOTE — Progress Notes (Signed)
Patient ID: Monica Schwartz, female  DOB: [birth year], 68 y.o.   MRN: [id number] Patient Care Team    Relationship Specialty Notifications Start End  Natalia Leatherwood, DO PCP - General Family Medicine  10/02/16   Francee Gentile, MD  Internal Medicine  10/02/16    Comment: rheumatologist- SLE    Chief Complaint  Patient presents with  . Annual Exam    Not Fasting. Needs mammogram and bone density per patient. Last mammogram was 7-8 yrs ago per patient. Does not get pap smears.     Subjective:  Monica Schwartz is a 68 y.o.  Female  present for CPE. All past medical history, surgical history, allergies, family history, immunizations, medications and social history were updated in the electronic medical record today. All recent labs, ED visits and hospitalizations within the last year were reviewed.  Essential hypertension/HLD:  Pt reports compliance with lisinopril 5 mg QD. Blood pressures ranges at home are not routinely checked. Patient denies chest pain, shortness of breath, dizziness or lower extremity edema.  Pt had cbc and cmp at rheumatologist>> reviewed  Pt takes a daily baby ASA. Pt is not prescribed statin.  TSH: Collected today Lipids: Collected today Diet: low sodium Exercise: routinely RF: HTN, HLD, Lupus, FHX stroke  Depression/anxiety:  Pt reports she is not sleeping as well and have more difficulty adjusting to all the changes with pandemic and her move. Her husband also passed away last year. She believes she will improve after she gets settled in the house she is moving into back in [place] near her friends. She denies HI or SI today.   Systemic lupus erythematosus, unspecified SLE type, unspecified organ involvement status (HCC) Followed by rheumatology. She reports compliance with tramadol TID PRN for pain, supplied by her rheumatologist. She is also prescribed MTX, plaquenil and prednisone.   Health maintenance:  Colonoscopy: completed 2014, pt reports  normal- no report.  Mammogram: Ordered at breast center.  Cervical cancer screening: > 65 Immunizations: tdap UTD 12/2013, Influenza provided today (encouraged yearly), PNA series completed, shingrix printed Infectious disease screening: HIV and  Hep C completed 2016 DEXA: last completed 08/2017 Assistive device: none Oxygen TIW:PYKD Patient has a Dental home. Hospitalizations/ED visits: reviewed  Depression screen [facility] 2/9 03/24/2019 09/18/2018 03/20/2018 09/17/2017 03/22/2017  Decreased Interest 2 1 - 1 0  Down, Depressed, Hopeless 2 1 0 1 1  PHQ - 2 Score 4 2 0 2 1  Altered sleeping 2 0 0 1 0  Tired, decreased energy 3 1 0 1 1  Change in appetite 3 1 0 1 1  Feeling bad or failure about yourself  2 1 0 1 0  Trouble concentrating 2 0 0 1 1  Moving slowly or fidgety/restless 2 0 0 2 0  Suicidal thoughts 2 0 0 0 0  PHQ-9 Score 20 5 0 9 4  Difficult doing work/chores Extremely dIfficult Not difficult at all Not difficult at all Somewhat difficult Somewhat difficult   GAD 7 : Generalized Anxiety Score 03/24/2019 09/18/2018 03/20/2018 03/20/2018  Nervous, Anxious, on Edge 3 1 - 0  Control/stop worrying 3 1 - 0  Worry too much - different things 3 1 - 0  Trouble relaxing 3 1 - 0  Restless 2 1 - 0  Easily annoyed or irritable 2 1 - 0  Afraid - awful might happen 3 1 0 -  Total GAD 7 Score 19 7 - -  Anxiety Difficulty Extremely difficult Not  difficult at all Not difficult at all Not difficult at all    Immunization History  Administered Date(s) Administered  . Fluad Quad(high Dose 65+) 03/24/2019  . Influenza Split 04/05/2016  . Influenza, High Dose Seasonal PF 03/20/2018  . Influenza,inj,Quad PF,6+ Mos 03/22/2017  . Pneumococcal Conjugate-13 06/21/2015  . Pneumococcal Polysaccharide-23 09/18/2018  . Tdap 01/25/2014    Past Medical History:  Diagnosis Date  . Allergy   . Anemia   . Anxiety   . Asthma   . Colon polyp    benign  . Constipation   . Diverticulosis   . Dysphagia    . Fatigue   . Fatty liver   . GERD (gastroesophageal reflux disease)   . H/O seasonal allergies   . Hiatal hernia   . History of tremor 09/2013  . Hyperlipidemia   . Hypertension   . Leucocytosis   . Lymphocytosis   . Major depressive disorder    with anxiety  . Microscopic polyangiitis ([place])   . Migraine   . Myalgia   . Odynophagia   . Osteoarthritis   . Raynauds syndrome   . Systemic lupus (HCC)    + ANA/DS-DNA, anti PR-3ab (neg C-ANCA), anti- cardiolipin IGM. Chronic leukocytosis and mild eosinophilia.    Allergies  Allergen Reactions  . Penicillins Shortness Of Breath and Rash  . Fosamax [Alendronate Sodium]   . Quetiapine Other (See Comments)    Tremor  . Seroquel [Quetiapine Fumarate] Other (See Comments)    Tremor   Past Surgical History:  Procedure Laterality Date  . ABDOMINAL HYSTERECTOMY  1989   with removal of ovaries  . APPENDECTOMY  1989  . CESAREAN SECTION  1984  . COLONOSCOPY  2014  . LAPAROSCOPIC ABDOMINAL EXPLORATION     x2 for endometrosis 1980 and 1984  . TONSILLECTOMY AND ADENOIDECTOMY  1970   Family History  Problem Relation Age of Onset  . Stroke Mother   . Arthritis Mother   . Depression Mother   . Alcohol abuse Father   . COPD Father   . Hearing loss Father   . Arthritis Brother   . Depression Brother   . Parkinson's disease Brother   . Depression Son   . Breast cancer Paternal Aunt   . Arthritis Paternal Aunt    Social History   Social History Narrative   Recently widowed March 2018. Has 2 children, one living named [place].   She is retired Scientific laboratory technician.   She drinks caffeine. Takes a daily vitamin.   Wears her seatbelt, wears a bicycle helmet.   Routinely exercises.   Smoke detector in the home.   Feels safe in her relationships.    Allergies as of 03/24/2019      Reactions   Penicillins Shortness Of Breath, Rash   Fosamax [alendronate Sodium]    Quetiapine Other (See Comments)   Tremor   Seroquel  [quetiapine Fumarate] Other (See Comments)   Tremor      Medication List       Accurate as of March 24, 2019 10:27 AM. If you have any questions, ask your nurse or doctor.        albuterol 108 (90 Base) MCG/ACT inhaler Commonly known as: VENTOLIN HFA Inhale 2 puffs into the lungs every 6 (six) hours as needed for wheezing or shortness of breath.   aspirin EC 81 MG tablet Take 81 mg by mouth.   Claritin-D 24 Hour 10-240 MG 24 hr tablet Generic drug: loratadine-pseudoephedrine Take 1 tablet  by mouth daily.   colchicine 0.6 MG tablet 0.6 mg. PRN   escitalopram 20 MG tablet Commonly known as: LEXAPRO Take 1 tablet (20 mg total) by mouth daily.   folic acid 1 MG tablet Commonly known as: FOLVITE Take 1 tablet by mouth daily.   hydroxychloroquine 200 MG tablet Commonly known as: PLAQUENIL Take 1.5 tablets by mouth daily.   lisinopril 5 MG tablet Commonly known as: ZESTRIL Take 1 tablet (5 mg total) by mouth daily.   methotrexate (PF) 50 MG/2ML injection INJECT 0.5 ML ONCE WEEKLY.   PARoxetine 10 MG tablet Commonly known as: Paxil Take 1 tablet (10 mg total) by mouth daily. Started by: Felix Pacinienee Kuneff, DO   predniSONE 5 MG tablet Commonly known as: DELTASONE Take 5 mg by mouth daily.   traMADol 50 MG tablet Commonly known as: ULTRAM Take 50 mg by mouth 3 (three) times daily as needed.   traZODone 100 MG tablet Commonly known as: DESYREL Take 1.5 tablets (150 mg total) by mouth at bedtime as needed for sleep. What changed: how much to take Changed by: Felix Pacinienee Kuneff, DO   triamcinolone cream 0.1 % Commonly known as: KENALOG APPLY AND RUB IN A THIN FILM TO AFFECTED AREAS TWICE DAILY.(AM AND PM).   Zoster Vaccine Adjuvanted injection Commonly known as: SHINGRIX Inject 0.5 mLs into the muscle once for 1 dose. Rpt dose in 2-6 months once Started by: Felix Pacinienee Kuneff, DO       All past medical history, surgical history, allergies, family history, immunizations  andmedications were updated in the EMR today and reviewed under the history and medication portions of their EMR.     Recent Results (from the past 2160 hour(s))  Novel Coronavirus, NAA ([facility])     Status: None   Collection Time: 03/17/19 12:00 AM   Specimen: Oropharyngeal(OP) collection in vial transport medium   OROPHARYNGEA  TESTING  Result Value Ref Range   SARS-CoV-2, NAA Not Detected Not Detected    Comment: This nucleic acid amplification test was developed and its performance characteristics determined by [facility]. Nucleic acid amplification tests include PCR and TMA. This test has not been FDA cleared or approved. This test has been authorized by FDA under an Emergency Use Authorization (EUA). This test is only authorized for the duration of time the declaration that circumstances exist justifying the authorization of the emergency use of in vitro diagnostic tests for detection of SARS-CoV-2 virus and/or diagnosis of COVID-19 infection under section 564(b)(1) of the Act, 21 U.S.C. 161WRU-0(A360bbb-3(b) (1), unless the authorization is terminated or revoked sooner. When diagnostic testing is negative, the possibility of a false negative result should be considered in the context of a patient's recent exposures and the presence of clinical signs and symptoms consistent with COVID-19. An individual without symptoms of COVID-19 and who is not shedding SARS-CoV-2 virus would  expect to have a negative (not detected) result in this assay.       ROS: 14 pt review of systems performed and negative (unless mentioned in an HPI)  Objective: BP 115/73 (BP Location: Left Arm, Patient Position: Sitting, Cuff Size: Normal)   Pulse (!) 102   Temp 97.8 F (36.6 C) (Temporal)   Resp 18   Ht 5' 0.5" (1.537 m)   Wt 148 lb (67.1 kg)   SpO2 99%   BMI 28.43 kg/m  Gen: Afebrile. No acute distress. Nontoxic in appearance, well-developed, well-nourished,  Pleasant caucasian female.   HENT: AT. [place]. Bilateral TM visualized and normal  in appearance, normal external auditory canal with a small excoriated area distal EAC. MMM, no oral lesions, adequate dentition. Bilateral nares within normal limits. Throat without erythema, ulcerations or exudates. no Cough on exam, no hoarseness on exam. Eyes:Pupils Equal Round Reactive to light, Extraocular movements intact,  Conjunctiva without redness, discharge or icterus. Neck/lymp/endocrine: Supple,no lymphadenopathy, no thyromegaly CV: RRR no  murmur, no edema, +2/4 P posterior tibialis pulses. no carotid bruits. No JVD. Chest: CTAB, no wheeze, rhonchi or crackles. normal Respiratory effort. good Air movement. Abd: Soft. flat. NTND. BS present. no Masses palpated. No hepatosplenomegaly. No rebound tenderness or guarding. Skin: no rashes, purpura or petechiae. Warm and well-perfused. Skin intact. Neuro/Msk:  Normal gait. PERLA. EOMi. Alert. Oriented x3.  Cranial nerves II through XII intact. Muscle strength 5/5 upper/lower extremity. DTRs equal bilaterally. Psych: Normal affect, dress and demeanor. Normal speech. Normal thought content and judgment. No SI or HI.   No exam data present  Assessment/plan: Monica Schwartz is a 68 y.o. female present for CPE Essential hypertension/HLD - stable.  Continue lisinopril to 5 mg QD - continue ASA 81 - low sodium diet. Exercise.  - f/u 6 months  Systemic lupus erythematosus, unspecified SLE type, unspecified organ involvement status (HCC) Followed by Rheumatology, continue. Prescribed Trmadol, MTX, plaquenil and chronic low dose steroid by Rheumatology.  Severe episode of recurrent major depressive disorder, without psychotic features (HCC)/Anxiety - Not as controlled.  - Continue lexapro 20 mg, increased Trazodone 150 mg QHS - added paxil 10 g QD  - F/U 2 months, unless needed sooner  Asthma: well controlled. refilled albuterol.   Preventive health exam:  Patient was encouraged to  exercise greater than 150 minutes a week. Patient was encouraged to choose a diet filled with fresh fruits and vegetables, and lean meats. AVS provided to patient today for education/recommendation on gender specific health and safety maintenance. Colonoscopy: completed 2014, pt reports normal- no report. She is uncertain of the doc in [place] Mammogram: Ordered at breast center.  Cervical cancer screening: > 65 Immunizations: tdap UTD 12/2013, Influenza provided today (encouraged yearly), PNA series completed, shingrix printed Infectious disease screening: HIV and  Hep C completed 2016 DEXA: last completed 08/2017  Follow up in 2 mos on depression and anxiety Return in about 6 months (around 09/21/2019) for [facility] (30 min). and  1 year CPE  Orders Placed This Encounter  Procedures  . MM 3D SCREEN BREAST BILATERAL  . Flu Vaccine QUAD High Dose(Fluad)  . Hemoglobin A1c  . Lipid panel  . TSH    Annual CPE was completed today in addition to > 15 minutes spent with patient, > 50% of that time face to face discussing worsening chronic conditions.   Electronically signed by: Felix Pacini, DO [facility]

## 2019-03-24 NOTE — Telephone Encounter (Signed)
Please inform patient the following information: Her LDL, which is the bad cholesterol is higher than last year 97>131. The rest of her panel is normal. This is still in the ok range. Her a1c was in the prediabetes range at 6.2, but her sugar was 87 (normal) - therefore, increasing exercise > 150 minutes a week as able to tolerate and diet low in sugars and high in fiber, vegetables and fresh fruits, lean meats will help keep this in normal range. No medications needed at this time. Some of this is likely from chronic steroid sue as well for her rheumatological disease.

## 2019-03-25 NOTE — Telephone Encounter (Signed)
Called patient and discussed labs. Patient verbalized understanding

## 2019-05-06 ENCOUNTER — Other Ambulatory Visit: Payer: Self-pay

## 2019-05-06 MED ORDER — ALBUTEROL SULFATE HFA 108 (90 BASE) MCG/ACT IN AERS: 2.0000 | INHALATION_SPRAY | Freq: Four times a day (QID) | RESPIRATORY_TRACT | 11 refills | Status: DC | PRN

## 2019-05-06 NOTE — Telephone Encounter (Signed)
Refill request faxed from [facility] for patients Albuterol (proair) Inhaler.  RF request for Pro air inhaler  LOV: CPE 03/24/2019  Next ov: Not scheduled  Last written:03/24/2019 6.7g 11 refills   Please advise

## 2019-06-16 DIAGNOSIS — M329 Systemic lupus erythematosus, unspecified: Secondary | ICD-10-CM | POA: Diagnosis not present

## 2019-06-16 DIAGNOSIS — Z13820 Encounter for screening for osteoporosis: Secondary | ICD-10-CM | POA: Diagnosis not present

## 2019-06-16 DIAGNOSIS — Z79899 Other long term (current) drug therapy: Secondary | ICD-10-CM | POA: Diagnosis not present

## 2019-06-16 DIAGNOSIS — M180 Bilateral primary osteoarthritis of first carpometacarpal joints: Secondary | ICD-10-CM | POA: Diagnosis not present

## 2019-09-24 DIAGNOSIS — M180 Bilateral primary osteoarthritis of first carpometacarpal joints: Secondary | ICD-10-CM | POA: Diagnosis not present

## 2019-09-24 DIAGNOSIS — Z79899 Other long term (current) drug therapy: Secondary | ICD-10-CM | POA: Diagnosis not present

## 2019-09-24 DIAGNOSIS — M329 Systemic lupus erythematosus, unspecified: Secondary | ICD-10-CM | POA: Diagnosis not present

## 2019-12-10 ENCOUNTER — Encounter: Payer: Self-pay | Admitting: Family Medicine

## 2019-12-10 ENCOUNTER — Other Ambulatory Visit: Payer: Self-pay

## 2019-12-10 ENCOUNTER — Ambulatory Visit (INDEPENDENT_AMBULATORY_CARE_PROVIDER_SITE_OTHER): Payer: Medicare Other | Admitting: Family Medicine

## 2019-12-10 VITALS — BP 113/76 | HR 105 | Temp 98.5°F | Resp 17 | Ht 60.5 in | Wt 145.0 lb

## 2019-12-10 DIAGNOSIS — D649 Anemia, unspecified: Secondary | ICD-10-CM

## 2019-12-10 DIAGNOSIS — M542 Cervicalgia: Secondary | ICD-10-CM

## 2019-12-10 DIAGNOSIS — M4722 Other spondylosis with radiculopathy, cervical region: Secondary | ICD-10-CM

## 2019-12-10 DIAGNOSIS — E782 Mixed hyperlipidemia: Secondary | ICD-10-CM

## 2019-12-10 DIAGNOSIS — M4802 Spinal stenosis, cervical region: Secondary | ICD-10-CM

## 2019-12-10 DIAGNOSIS — J4520 Mild intermittent asthma, uncomplicated: Secondary | ICD-10-CM

## 2019-12-10 DIAGNOSIS — M4310 Spondylolisthesis, site unspecified: Secondary | ICD-10-CM

## 2019-12-10 DIAGNOSIS — F332 Major depressive disorder, recurrent severe without psychotic features: Secondary | ICD-10-CM | POA: Diagnosis not present

## 2019-12-10 DIAGNOSIS — M329 Systemic lupus erythematosus, unspecified: Secondary | ICD-10-CM

## 2019-12-10 DIAGNOSIS — R5383 Other fatigue: Secondary | ICD-10-CM

## 2019-12-10 DIAGNOSIS — F419 Anxiety disorder, unspecified: Secondary | ICD-10-CM | POA: Diagnosis not present

## 2019-12-10 DIAGNOSIS — M47812 Spondylosis without myelopathy or radiculopathy, cervical region: Secondary | ICD-10-CM

## 2019-12-10 DIAGNOSIS — I10 Essential (primary) hypertension: Secondary | ICD-10-CM | POA: Diagnosis not present

## 2019-12-10 LAB — COMPREHENSIVE METABOLIC PANEL
ALT: 16 U/L (ref 0–35)
AST: 18 U/L (ref 0–37)
Albumin: 4.2 g/dL (ref 3.5–5.2)
Alkaline Phosphatase: 111 U/L (ref 39–117)
BUN: 14 mg/dL (ref 6–23)
CO2: 27 mEq/L (ref 19–32)
Calcium: 9.7 mg/dL (ref 8.4–10.5)
Chloride: 104 mEq/L (ref 96–112)
Creatinine, Ser: 0.69 mg/dL (ref 0.40–1.20)
GFR: 84.74 mL/min (ref >60.00)
Glucose, Bld: 108 mg/dL — ABNORMAL HIGH (ref 70–99)
Potassium: 3.9 mEq/L (ref 3.5–5.1)
Sodium: 139 mEq/L (ref 135–145)
Total Bilirubin: 0.3 mg/dL (ref 0.2–1.2)
Total Protein: 6.5 g/dL (ref 6.0–8.3)

## 2019-12-10 LAB — TSH: TSH: 2.57 u[IU]/mL (ref 0.35–4.50)

## 2019-12-10 LAB — CBC WITH DIFFERENTIAL/PLATELET
Basophils Absolute: 0.2 10*3/uL — ABNORMAL HIGH (ref 0.0–0.1)
Basophils Relative: 1.8 % (ref 0.0–3.0)
Eosinophils Absolute: 0.7 10*3/uL (ref 0.0–0.7)
Eosinophils Relative: 6.5 % — ABNORMAL HIGH (ref 0.0–5.0)
HCT: 40.3 % (ref 36.0–46.0)
Hemoglobin: 13.2 g/dL (ref 12.0–15.0)
Lymphocytes Relative: 24.2 % (ref 12.0–46.0)
Lymphs Abs: 2.5 10*3/uL (ref 0.7–4.0)
MCHC: 32.8 g/dL (ref 30.0–36.0)
MCV: 94.7 fl (ref 78.0–100.0)
Monocytes Absolute: 0.8 10*3/uL (ref 0.1–1.0)
Monocytes Relative: 7.6 % (ref 3.0–12.0)
Neutro Abs: 6.2 10*3/uL (ref 1.4–7.7)
Neutrophils Relative %: 59.9 % (ref 43.0–77.0)
Platelets: 304 10*3/uL (ref 150.0–400.0)
RBC: 4.25 Mil/uL (ref 3.87–5.11)
RDW: 13.7 % (ref 11.5–15.5)
WBC: 10.4 10*3/uL (ref 4.0–10.5)

## 2019-12-10 LAB — B12 AND FOLATE PANEL
Folate: >24.8 ng/mL (ref >5.9)
Vitamin B-12: 387 pg/mL (ref 211–911)

## 2019-12-10 LAB — VITAMIN D 25 HYDROXY (VIT D DEFICIENCY, FRACTURES): VITD: 28.14 ng/mL — ABNORMAL LOW (ref 30.00–100.00)

## 2019-12-10 MED ORDER — PAROXETINE HCL 20 MG PO TABS
20.0000 mg | ORAL_TABLET | Freq: Every day | ORAL | 1 refills | Status: DC
Start: 2019-12-10 — End: 2020-05-31

## 2019-12-10 MED ORDER — TRAZODONE HCL 100 MG PO TABS: 150.0000 mg | ORAL_TABLET | Freq: Every evening | ORAL | 1 refills | Status: DC | PRN

## 2019-12-10 MED ORDER — ESCITALOPRAM OXALATE 20 MG PO TABS: 20.0000 mg | ORAL_TABLET | Freq: Every day | ORAL | 1 refills | Status: DC

## 2019-12-10 MED ORDER — LISINOPRIL 5 MG PO TABS: 5.0000 mg | ORAL_TABLET | Freq: Every day | ORAL | 1 refills | Status: DC

## 2019-12-10 NOTE — Patient Instructions (Signed)
Great to see you today. I have refilled your medications.  We did increase the paxil dose for you today.   I will call you with lab results once they are all received.    We will place a referral for you to discuss restarting neck injections. They will call you.    Next appt in 5.5 mos (end of November)- unless labs indicate we need to see you sooner.   Please check your iron supplement dose when you go home and let us know the dose. Thanks.

## 2019-12-10 NOTE — Progress Notes (Signed)
Patient ID: Monica Schwartz, female  DOB: [birth year], 69 y.o.   MRN: [id number] Patient Care Team    Relationship Specialty Notifications Start End  Ma Hillock, DO PCP - General Family Medicine  10/02/16   Hermelinda Medicus, MD  Internal Medicine  10/02/16    Comment: rheumatologist- SLE    Chief Complaint  Patient presents with  . Depression  . Medication Management    Subjective: Monica Schwartz is a 69 y.o.  Female  present for  Essential hypertension/HLD:  Pt reports  compliance with lisinopril 5 mg QD. Patient denies chest pain, shortness of breath, dizziness or lower extremity edema.  Pt takes a daily baby ASA. Pt is not prescribed statin.  Diet: low sodium Exercise: routinely RF: HTN, HLD, Lupus, FHX stroke  Depression/anxiety:  Pt reports she is doing okay for her depression anxiety standpoint.  She would like to continue the Lexapro 20 mg and the trazodone nightly.  She was started on low-dose Paxil her last appointment secondary to increase in depression anxiety symptoms.  She reports she discontinued it because she did not feel it was helpful.  She does endorse having mildly increased stress and anxiety and is willing to reconsider.  Systemic lupus erythematosus, unspecified SLE type, unspecified organ involvement status ([place]) Followed by rheumatology. She reports compliance with tramadol TID PRN for pain, supplied by her rheumatologist. She is also prescribed MTX, plaquenil and prednisone.   She has stopped the methotrexate.  She reports it makes her feel bad.  She endorses increased feelings of fatigue and is wondering is her a supplement she could be taking to help her with her energy levels.  She started a daily iron supplement of unknown dose.  She takes a multivitamin.  She has continued to take folate.  She states her rheumatologist office had taken blood and told her she was becoming anemic about a month ago.  Asthma: continues to use albuterol prn. Needs  only occasionally.  Depression screen [facility] 2/9 12/10/2019 03/24/2019 09/18/2018 03/20/2018 09/17/2017  Decreased Interest '1 2 1 ' - 1  Down, Depressed, Hopeless '1 2 1 ' 0 1  PHQ - 2 Score '2 4 2 ' 0 2  Altered sleeping 2 2 0 0 1  Tired, decreased energy '3 3 1 ' 0 1  Change in appetite '1 3 1 ' 0 1  Feeling bad or failure about yourself  0 2 1 0 1  Trouble concentrating 0 2 0 0 1  Moving slowly or fidgety/restless 1 2 0 0 2  Suicidal thoughts 1 2 0 0 0  PHQ-9 Score '10 20 5 ' 0 9  Difficult doing work/chores Very difficult Extremely dIfficult Not difficult at all Not difficult at all Somewhat difficult   GAD 7 : Generalized Anxiety Score 03/24/2019 09/18/2018 03/20/2018 03/20/2018  Nervous, Anxious, on Edge 3 1 - 0  Control/stop worrying 3 1 - 0  Worry too much - different things 3 1 - 0  Trouble relaxing 3 1 - 0  Restless 2 1 - 0  Easily annoyed or irritable 2 1 - 0  Afraid - awful might happen 3 1 0 -  Total GAD 7 Score 19 7 - -  Anxiety Difficulty Extremely difficult Not difficult at all Not difficult at all Not difficult at all    Immunization History  Administered Date(s) Administered  . Fluad Quad(high Dose 65+) 03/24/2019  . Influenza Split 04/05/2016  . Influenza, High Dose Seasonal PF 03/20/2018  . Influenza,inj,Quad PF,6+ Mos  03/22/2017  . Pneumococcal Conjugate-13 06/21/2015  . Pneumococcal Polysaccharide-23 09/18/2018  . Tdap 01/25/2014    Past Medical History:  Diagnosis Date  . Allergy   . Anemia   . Anxiety   . Asthma   . Colon polyp    benign  . Constipation   . Diverticulosis   . Dysphagia   . Fatigue   . Fatty liver   . GERD (gastroesophageal reflux disease)   . H/O seasonal allergies   . Hiatal hernia   . History of tremor 09/2013  . Hyperlipidemia   . Hypertension   . Leucocytosis   . Lymphocytosis   . Major depressive disorder    with anxiety  . Microscopic polyangiitis ([place])   . Migraine   . Myalgia   . Odynophagia   . Osteoarthritis   . Raynauds syndrome    . Systemic lupus (HCC)    + ANA/DS-DNA, anti PR-3ab (neg C-ANCA), anti- cardiolipin IGM. Chronic leukocytosis and mild eosinophilia.    Allergies  Allergen Reactions  . Penicillins Shortness Of Breath and Rash  . Fosamax [Alendronate Sodium]   . Quetiapine Other (See Comments)    Tremor  . Seroquel [Quetiapine Fumarate] Other (See Comments)    Tremor   Past Surgical History:  Procedure Laterality Date  . ABDOMINAL HYSTERECTOMY  1989   with removal of ovaries  . APPENDECTOMY  1989  . CESAREAN SECTION  1984  . COLONOSCOPY  2014  . LAPAROSCOPIC ABDOMINAL EXPLORATION     x2 for endometrosis 1980 and 1984  . TONSILLECTOMY AND ADENOIDECTOMY  1970   Family History  Problem Relation Age of Onset  . Stroke Mother   . Arthritis Mother   . Depression Mother   . Alcohol abuse Father   . COPD Father   . Hearing loss Father   . Arthritis Brother   . Depression Brother   . Parkinson's disease Brother   . Depression Son   . Breast cancer Paternal Aunt   . Arthritis Paternal Aunt    Social History   Social History Narrative   Recently widowed March 2018. Has 2 children, one living named [place].   She is retired Scientific laboratory technician.   She drinks caffeine. Takes a daily vitamin.   Wears her seatbelt, wears a bicycle helmet.   Routinely exercises.   Smoke detector in the home.   Feels safe in her relationships.    Allergies as of 12/10/2019      Reactions   Penicillins Shortness Of Breath, Rash   Fosamax [alendronate Sodium]    Quetiapine Other (See Comments)   Tremor   Seroquel [quetiapine Fumarate] Other (See Comments)   Tremor      Medication List       Accurate as of December 10, 2019 12:11 PM. If you have any questions, ask your nurse or doctor.        STOP taking these medications   methotrexate (PF) 50 MG/2ML injection Stopped by: Howard Pouch, DO     TAKE these medications   albuterol 108 (90 Base) MCG/ACT inhaler Commonly known as: VENTOLIN  HFA Inhale 2 puffs into the lungs every 6 (six) hours as needed for wheezing or shortness of breath.   aspirin EC 81 MG tablet Take 81 mg by mouth.   Claritin-D 24 Hour 10-240 MG 24 hr tablet Generic drug: loratadine-pseudoephedrine Take 1 tablet by mouth daily.   colchicine 0.6 MG tablet 0.6 mg. PRN   escitalopram 20 MG tablet Commonly known  asLoma Sousa Take 1 tablet (20 mg total) by mouth daily.   folic acid 1 MG tablet Commonly known as: FOLVITE Take 1 tablet by mouth daily.   hydroxychloroquine 200 MG tablet Commonly known as: PLAQUENIL Take 1.5 tablets by mouth daily.   lisinopril 5 MG tablet Commonly known as: ZESTRIL Take 1 tablet (5 mg total) by mouth daily.   PARoxetine 20 MG tablet Commonly known as: Paxil Take 1 tablet (20 mg total) by mouth daily. What changed:   medication strength  how much to take Changed by: Howard Pouch, DO   predniSONE 5 MG tablet Commonly known as: DELTASONE Take 5 mg by mouth daily.   traMADol 50 MG tablet Commonly known as: ULTRAM Take 50 mg by mouth 3 (three) times daily as needed.   traZODone 100 MG tablet Commonly known as: DESYREL Take 1.5 tablets (150 mg total) by mouth at bedtime as needed for sleep.   triamcinolone cream 0.1 % Commonly known as: KENALOG APPLY AND RUB IN A THIN FILM TO AFFECTED AREAS TWICE DAILY.(AM AND PM).       All past medical history, surgical history, allergies, family history, immunizations andmedications were updated in the EMR today and reviewed under the history and medication portions of their EMR.     No results found for this or any previous visit (from the past 2160 hour(s)).    ROS: 14 pt review of systems performed and negative (unless mentioned in an HPI)  Objective: BP 113/76 (BP Location: Right Arm, Patient Position: Sitting, Cuff Size: Normal)   Pulse (!) 105   Temp 98.5 F (36.9 C) (Temporal)   Resp 17   Ht 5' 0.5" (1.537 m)   Wt 145 lb (65.8 kg)   SpO2 97%    BMI 27.85 kg/m  Gen: Afebrile. No acute distress.  Nontoxic in appearance, well-developed, well-nourished, very pleasant female. HENT: AT. [place].  Eyes:Pupils Equal Round Reactive to light, Extraocular movements intact,  Conjunctiva without redness, discharge or icterus. Neck/lymp/endocrine: Supple, no lymphadenopathy, no thyromegaly CV: RRR no murmur, no edema Chest: CTAB, no wheeze or crackles MSK: Decreased range of motion cervical spine Skin: No rashes, purpura or petechiae.  Neuro:  Normal gait. PERLA. EOMi. Alert. Oriented x3 Psych: Normal affect, dress and demeanor. Normal speech. Normal thought content and judgment.   No exam data present  Assessment/plan: Monica Schwartz is a 69 y.o. female present for CPE Essential hypertension/HLD - stable.  Continue lisinopril to 5 mg QD - continue ASA 81 - low sodium diet. Exercise.   Systemic lupus erythematosus, unspecified SLE type, unspecified organ involvement status ([place]) Followed by Rheumatology, continue. Prescribed Trmadol, MTX, plaquenil and chronic low dose steroid by Rheumatology.  Patient currently stopped methotrexate.  Severe episode of recurrent major depressive disorder, without psychotic features (HCC)/Anxiety - Patient reports she is doing okay, but could still be better controlled.  She had stopped Paxil on her own because she did not feel it was helpful.  We discussed today that was a extremely low dose and if she tolerated the Paxil without side effect we could always attempt to increase the dose to get her more coverage.  She would like to try that today. - Continue lexapro 20 mg, I -Continue trazodone 150 mg QHS -Increase Paxil to 20 mg daily - F/U 5.5 months, sooner if needed  Asthma:  Well-controlled. Continue albuterol as needed  Fatigue, unspecified type/anemia new problem. Patient complained of worsening fatigue today.  She did have labs collected at  her rheumatologist office which she stated showed she  was anemic. Continue folate she started an iron supplement daily, I have asked her to check the dose and let us know so that we can guide her better once we get the results back. Fatigue possibly could be multifactorial with her depression/anxiety and autoimmune disorders. - CBC w/Diff - Iron, TIBC and Ferritin Panel - B12 and Folate Panel - Vitamin D (25 hydroxy) - TSH - Comp Met (CMET) once we get results back from labs today, we will discuss if she is in need of any supplements or treatment for her fatigue and anemia.  Neck pain/Osteoarthritis of spine with radiculopathy, cervical region/Foraminal stenosis of cervical region/Facet arthropathy, cervical/Anterolisthesis, C4-5/Mild intermittent asthma, unspecified whether complicated new problem. Reviewed MRI from 2017 scanned into the system.  Patient has rather significant cervical spine pathology.  She is interested in better pain control by cervical injections if possible.  She reports she had these in the past but her prior neurologist has retired. -Referral to Ortho/neuro/PMR placed today.  First choice Dr. Nelva Bush if in network.  Follow-up in 5 and half months on chronic conditions, sooner if needed or if labs indicate need for sooner follow-up.  Orders Placed This Encounter  Procedures  . CBC w/Diff  . Iron, TIBC and Ferritin Panel  . B12 and Folate Panel  . Vitamin D (25 hydroxy)  . TSH  . Comp Met (CMET)  . Ambulatory referral to Orthopedic Surgery   Meds ordered this encounter  Medications  . escitalopram (LEXAPRO) 20 MG tablet    Sig: Take 1 tablet (20 mg total) by mouth daily.    Dispense:  90 tablet    Refill:  1  . lisinopril (ZESTRIL) 5 MG tablet    Sig: Take 1 tablet (5 mg total) by mouth daily.    Dispense:  90 tablet    Refill:  1  . PARoxetine (PAXIL) 20 MG tablet    Sig: Take 1 tablet (20 mg total) by mouth daily.    Dispense:  90 tablet    Refill:  1  . traZODone (DESYREL) 100 MG tablet    Sig: Take 1.5  tablets (150 mg total) by mouth at bedtime as needed for sleep.    Dispense:  135 tablet    Refill:  1    Referral Orders     Ambulatory referral to Orthopedic Surgery   > 40 Minutes was dedicated to this patient's encounter to include pre-visit review of chart, face-to-face time with patient and post-visit work- which include documentation and prescribing medications and/or ordering test when necessary.     Electronically signed by: Howard Pouch, DO [place]

## 2019-12-11 ENCOUNTER — Telehealth: Payer: Self-pay

## 2019-12-11 ENCOUNTER — Telehealth: Payer: Self-pay | Admitting: Family Medicine

## 2019-12-11 NOTE — Telephone Encounter (Signed)
Pt was called and it kept saying "call cannot be completed as dialed" I tried both numbers listed on chart. Sent my chart message with results.   Pt called earlier and said: Dr. Claiborne Billings asked patient to call back with the type/dosage of Iron pill she was taking Per Lynden Ang she is taking   Ashby Dawes Made brand 65mg 

## 2019-12-11 NOTE — Telephone Encounter (Signed)
Dr. Claiborne Billings asked patient to call back with the type/dosage of Iron pill she was taking  Per Lynden Ang she is taking   Monica Schwartz Made brand 65mg   Any other questions, patient can be reached at [phone]

## 2019-12-11 NOTE — Telephone Encounter (Signed)
Please add iron to her med list. This is 65 mg of elemental iron which is the same as 325 mg of ferrous sulfate-you can use whichever is listed as a choice for iron.

## 2019-12-11 NOTE — Telephone Encounter (Signed)
Please inform patient Monica Schwartz liver, kidney and thyroid are functioning normal. Monica Schwartz blood cell counts and electrolytes are normal. She is no longer anemic. Folate levels are normal. Monica Schwartz vitamin D is mildly low at 28. I would encourage Monica Schwartz to start 600-800 units of vitamin D over-the-counter daily. Monica Schwartz B12 is at the low end of normal at 387> would encourage Monica Schwartz to start a daily B12 1000 mcg oral supplement.  Inadequate B12 and vitamin D can cause symptoms of fatigue. Recommend she start the above dosing and at Monica Schwartz next scheduled follow-up in 5 and half months we will recheck those levels.   Monica Schwartz iron panel is pending and will take until next week for that to return. We will call Monica Schwartz with those results also.

## 2019-12-11 NOTE — Telephone Encounter (Signed)
See other note

## 2019-12-12 LAB — IRON,TIBC AND FERRITIN PANEL
%SAT: 36 % (calc) (ref 16–45)
Ferritin: 42 ng/mL (ref 16–288)
Iron: 125 ug/dL (ref 45–160)
TIBC: 348 mcg/dL (calc) (ref 250–450)

## 2019-12-14 NOTE — Telephone Encounter (Signed)
Medication added to med list.

## 2019-12-16 ENCOUNTER — Telehealth: Payer: Self-pay

## 2019-12-16 NOTE — Telephone Encounter (Signed)
Pt was called and given information/lab results. Staff message sent to make sure they knew that contact info had been updated.

## 2019-12-16 NOTE — Telephone Encounter (Signed)
Calling about test results, stated that she has not heard anything and thought she would check in to see if she had given [place] the correct contact number for her. And she did not. Patient gave the wrong contact number, I have since, then, corrected the number with the correct one.  Please call patient [phone].

## 2019-12-23 ENCOUNTER — Telehealth: Payer: Self-pay

## 2019-12-23 DIAGNOSIS — F419 Anxiety disorder, unspecified: Secondary | ICD-10-CM

## 2019-12-23 DIAGNOSIS — F064 Anxiety disorder due to known physiological condition: Secondary | ICD-10-CM

## 2019-12-23 NOTE — Telephone Encounter (Signed)
Patient called in wanting to have a referral sent to Dr. Milagros Evener, MD [facility]   Please advise

## 2019-12-23 NOTE — Telephone Encounter (Signed)
Okay to place referral for patient.

## 2019-12-24 NOTE — Telephone Encounter (Signed)
Referral was placed. Pt notified.

## 2019-12-24 NOTE — Addendum Note (Signed)
Addended by: Daryel November L on: 12/24/2019 08:26 AM   Modules accepted: Orders

## 2019-12-31 DIAGNOSIS — M180 Bilateral primary osteoarthritis of first carpometacarpal joints: Secondary | ICD-10-CM | POA: Diagnosis not present

## 2019-12-31 DIAGNOSIS — M329 Systemic lupus erythematosus, unspecified: Secondary | ICD-10-CM | POA: Diagnosis not present

## 2019-12-31 DIAGNOSIS — Z79899 Other long term (current) drug therapy: Secondary | ICD-10-CM | POA: Diagnosis not present

## 2020-01-18 DIAGNOSIS — M329 Systemic lupus erythematosus, unspecified: Secondary | ICD-10-CM | POA: Diagnosis not present

## 2020-02-17 DIAGNOSIS — M5136 Other intervertebral disc degeneration, lumbar region: Secondary | ICD-10-CM | POA: Diagnosis not present

## 2020-02-17 DIAGNOSIS — M50322 Other cervical disc degeneration at C5-C6 level: Secondary | ICD-10-CM | POA: Diagnosis not present

## 2020-02-17 DIAGNOSIS — M50323 Other cervical disc degeneration at C6-C7 level: Secondary | ICD-10-CM | POA: Diagnosis not present

## 2020-03-03 DIAGNOSIS — M5030 Other cervical disc degeneration, unspecified cervical region: Secondary | ICD-10-CM | POA: Diagnosis not present

## 2020-03-22 DIAGNOSIS — M5030 Other cervical disc degeneration, unspecified cervical region: Secondary | ICD-10-CM | POA: Diagnosis not present

## 2020-03-22 DIAGNOSIS — M5136 Other intervertebral disc degeneration, lumbar region: Secondary | ICD-10-CM | POA: Diagnosis not present

## 2020-03-30 IMAGING — DX DG RIBS 2V*L*
2 series · 2 of 2 positions shown · non-contrast
Comparison: Chest x-ray 04/05/2016.

CLINICAL DATA: 66-year-old female with history of trauma from fall
2 weeks ago sided rib pain and cough for 1 week.

EXAM:
CHEST - 2 VIEW; LEFT RIBS - 2 VIEW

[rib pa]
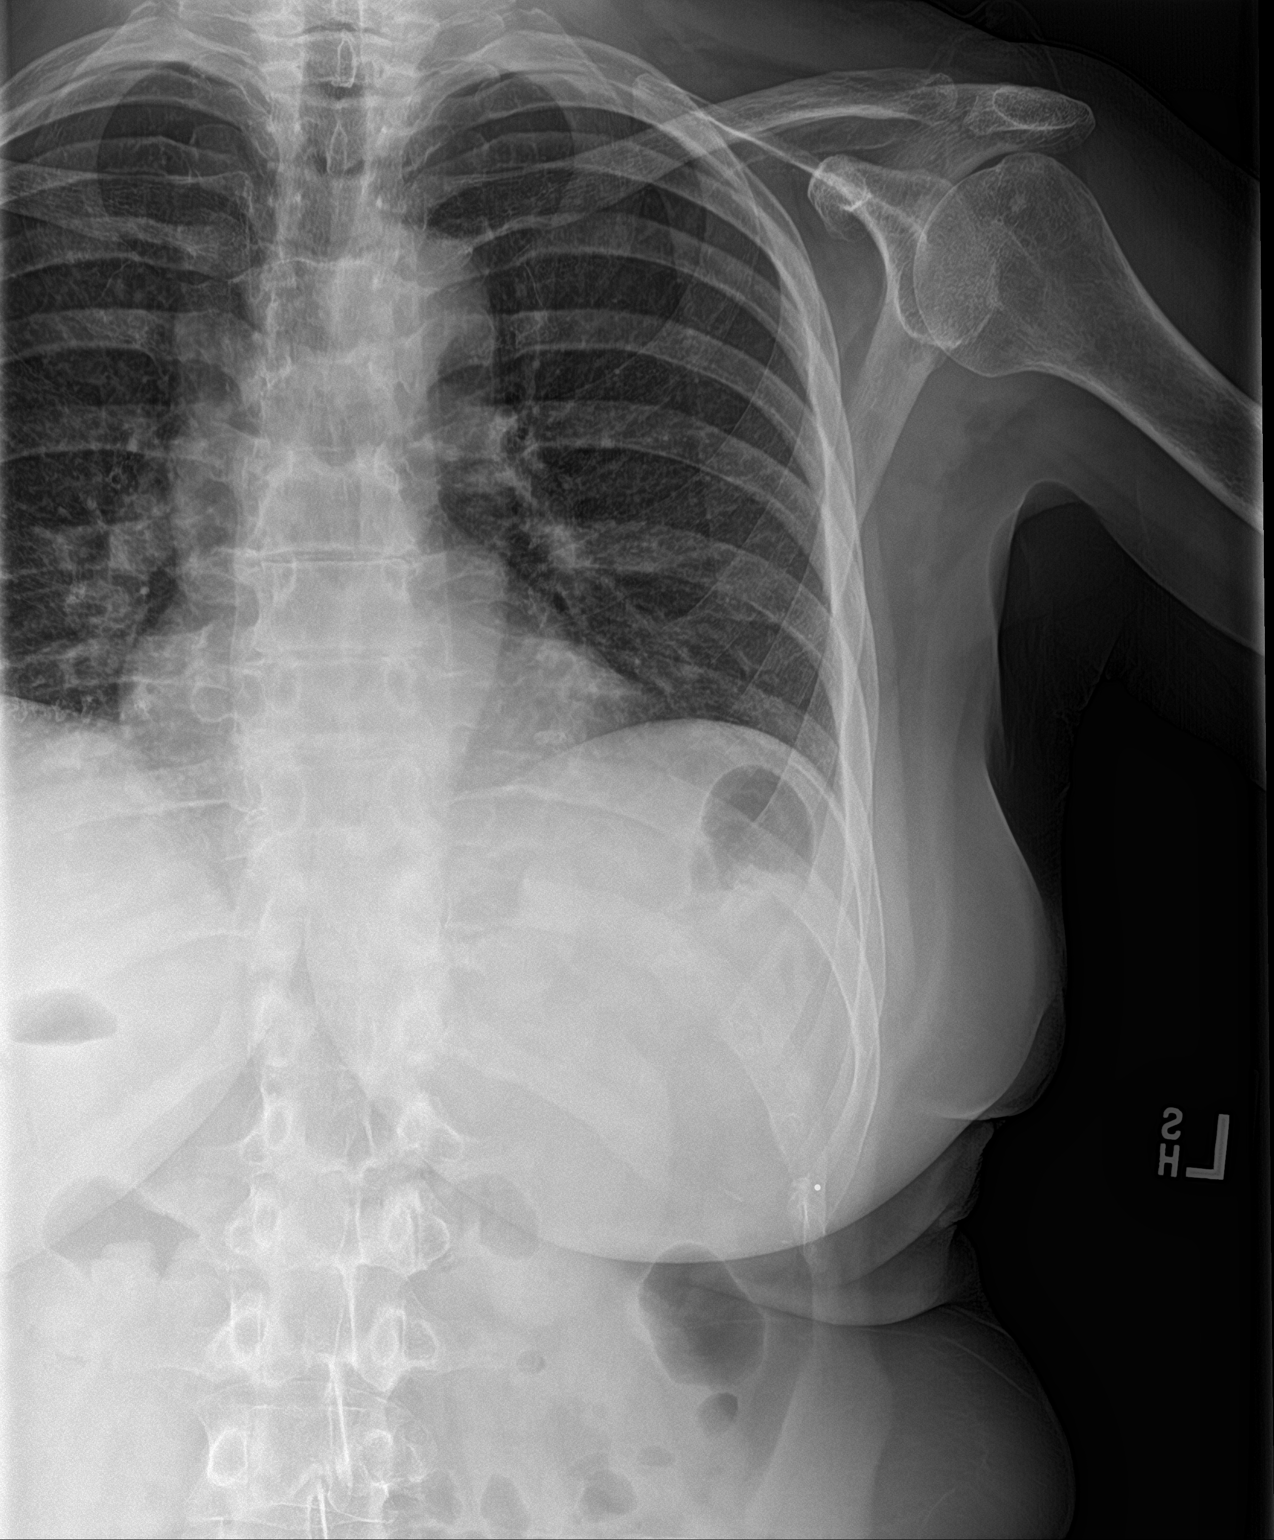

[rib pa obl]
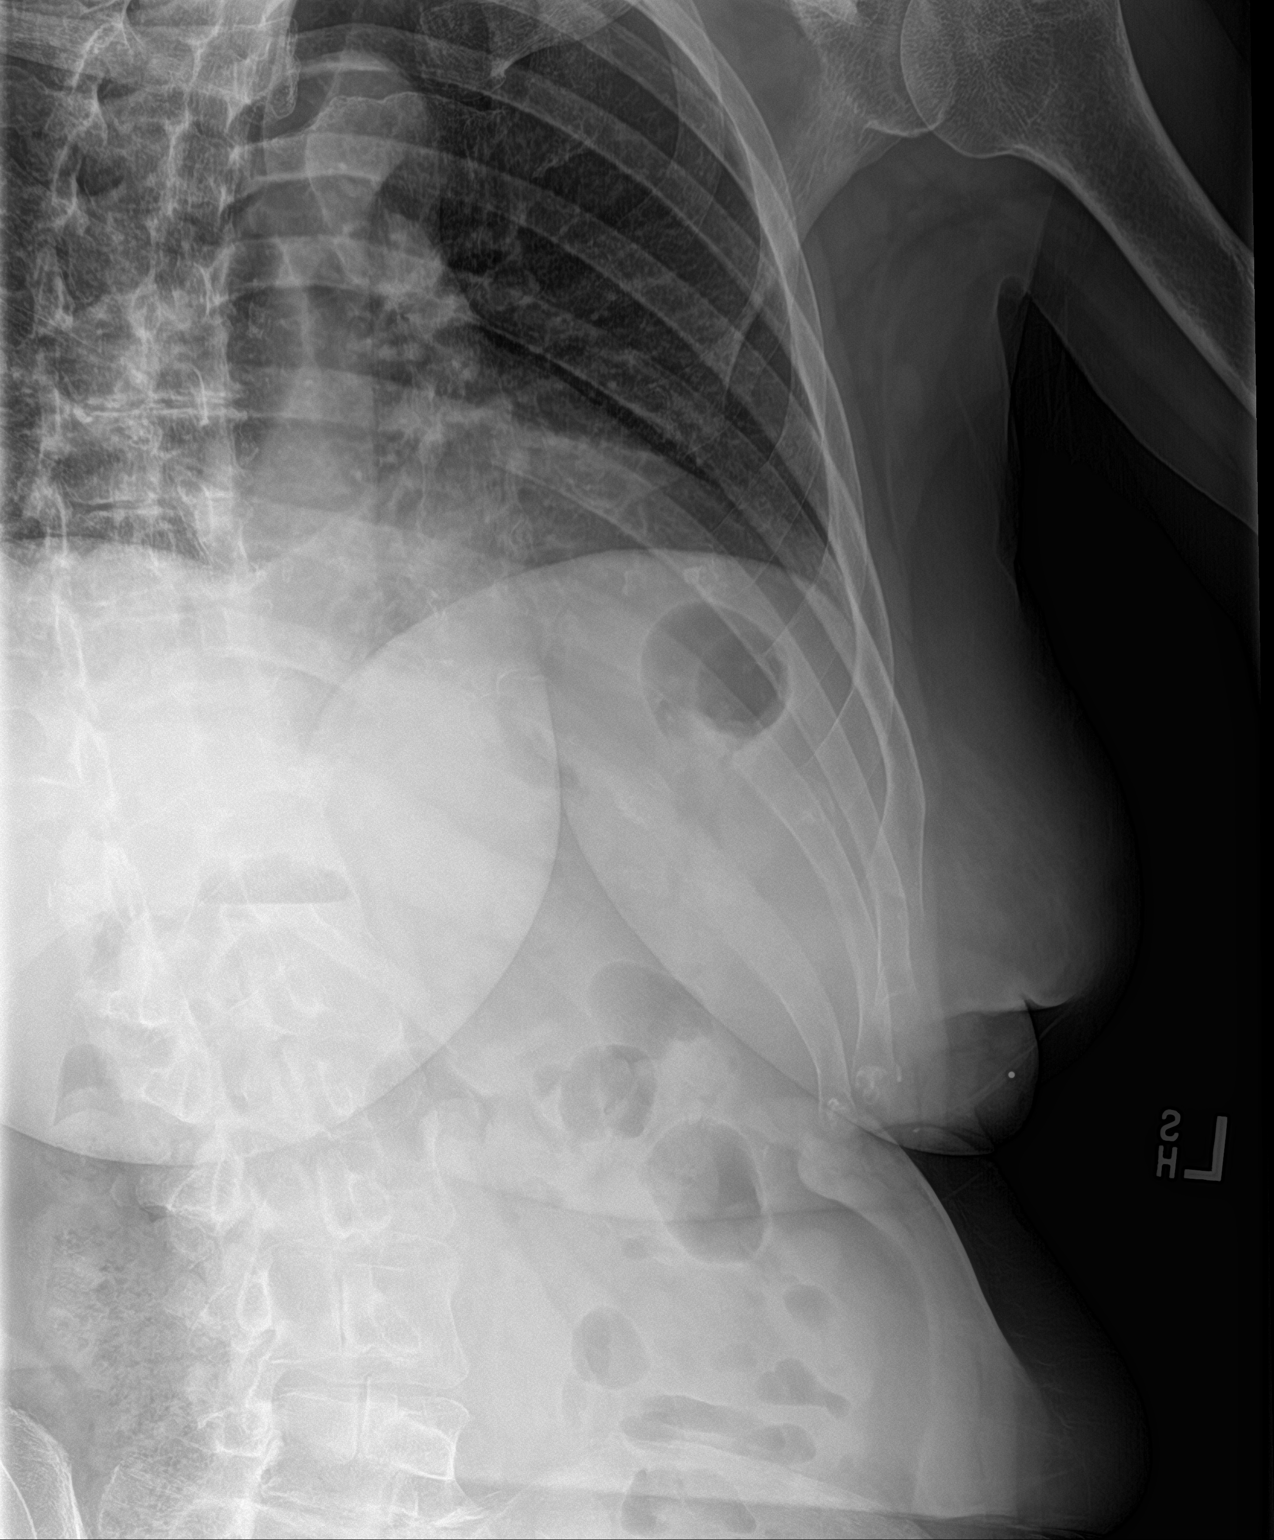

[2 of 2 positions shown; findings below may reference images not displayed]

FINDINGS: Lung volumes are normal. No consolidative airspace disease. No
pleural effusions. No pneumothorax. No pulmonary nodule or mass
noted. Pulmonary vasculature and the cardiomediastinal silhouette
are within normal limits.

Dedicated views of the left ribs demonstrate no acute displaced
left-sided rib fractures.
IMPRESSION: 1. No radiographic evidence of acute cardiopulmonary disease.
2. No acute displaced left-sided rib fractures.

## 2020-04-14 DIAGNOSIS — M180 Bilateral primary osteoarthritis of first carpometacarpal joints: Secondary | ICD-10-CM | POA: Diagnosis not present

## 2020-04-14 DIAGNOSIS — Z79899 Other long term (current) drug therapy: Secondary | ICD-10-CM | POA: Diagnosis not present

## 2020-04-14 DIAGNOSIS — Z23 Encounter for immunization: Secondary | ICD-10-CM | POA: Diagnosis not present

## 2020-04-14 DIAGNOSIS — M329 Systemic lupus erythematosus, unspecified: Secondary | ICD-10-CM | POA: Diagnosis not present

## 2020-05-17 DIAGNOSIS — M5136 Other intervertebral disc degeneration, lumbar region: Secondary | ICD-10-CM | POA: Diagnosis not present

## 2020-05-17 DIAGNOSIS — M47812 Spondylosis without myelopathy or radiculopathy, cervical region: Secondary | ICD-10-CM | POA: Diagnosis not present

## 2020-05-17 DIAGNOSIS — M79645 Pain in left finger(s): Secondary | ICD-10-CM | POA: Diagnosis not present

## 2020-05-17 DIAGNOSIS — M5030 Other cervical disc degeneration, unspecified cervical region: Secondary | ICD-10-CM | POA: Diagnosis not present

## 2020-05-17 DIAGNOSIS — M5416 Radiculopathy, lumbar region: Secondary | ICD-10-CM | POA: Diagnosis not present

## 2020-05-31 ENCOUNTER — Other Ambulatory Visit: Payer: Self-pay

## 2020-05-31 ENCOUNTER — Ambulatory Visit (INDEPENDENT_AMBULATORY_CARE_PROVIDER_SITE_OTHER): Payer: Medicare Other | Admitting: Family Medicine

## 2020-05-31 ENCOUNTER — Encounter: Payer: Self-pay | Admitting: Family Medicine

## 2020-05-31 VITALS — BP 102/69 | HR 115 | Temp 98.6°F | Ht 60.0 in | Wt 148.0 lb

## 2020-05-31 DIAGNOSIS — E538 Deficiency of other specified B group vitamins: Secondary | ICD-10-CM | POA: Diagnosis not present

## 2020-05-31 DIAGNOSIS — E782 Mixed hyperlipidemia: Secondary | ICD-10-CM

## 2020-05-31 DIAGNOSIS — J4520 Mild intermittent asthma, uncomplicated: Secondary | ICD-10-CM | POA: Diagnosis not present

## 2020-05-31 DIAGNOSIS — F332 Major depressive disorder, recurrent severe without psychotic features: Secondary | ICD-10-CM

## 2020-05-31 DIAGNOSIS — M329 Systemic lupus erythematosus, unspecified: Secondary | ICD-10-CM

## 2020-05-31 DIAGNOSIS — M317 Microscopic polyangiitis: Secondary | ICD-10-CM

## 2020-05-31 DIAGNOSIS — E559 Vitamin D deficiency, unspecified: Secondary | ICD-10-CM | POA: Diagnosis not present

## 2020-05-31 DIAGNOSIS — F419 Anxiety disorder, unspecified: Secondary | ICD-10-CM | POA: Diagnosis not present

## 2020-05-31 DIAGNOSIS — I10 Essential (primary) hypertension: Secondary | ICD-10-CM

## 2020-05-31 DIAGNOSIS — Z1231 Encounter for screening mammogram for malignant neoplasm of breast: Secondary | ICD-10-CM

## 2020-05-31 LAB — B12 AND FOLATE PANEL
Folate: >23.6 ng/mL
Vitamin B-12: 388 pg/mL (ref 211–911)

## 2020-05-31 LAB — VITAMIN D 25 HYDROXY (VIT D DEFICIENCY, FRACTURES): VITD: 23.77 ng/mL — ABNORMAL LOW (ref 30.00–100.00)

## 2020-05-31 MED ORDER — ALBUTEROL SULFATE HFA 108 (90 BASE) MCG/ACT IN AERS: 2.0000 | INHALATION_SPRAY | Freq: Four times a day (QID) | RESPIRATORY_TRACT | 11 refills | Status: DC | PRN

## 2020-05-31 MED ORDER — TRAZODONE HCL 100 MG PO TABS: 200.0000 mg | ORAL_TABLET | Freq: Every evening | ORAL | 1 refills | Status: DC | PRN

## 2020-05-31 MED ORDER — LISINOPRIL 5 MG PO TABS: 5.0000 mg | ORAL_TABLET | Freq: Every day | ORAL | 1 refills | Status: DC

## 2020-05-31 NOTE — Patient Instructions (Signed)
BP looks great. I have refilled your medications.   Next appt mid May.  I hope you have a wonderful Holiday.

## 2020-05-31 NOTE — Progress Notes (Signed)
Patient ID: Monica Schwartz, female  DOB: [birth year], 69 y.o.   MRN: [id number] Patient Care Team    Relationship Specialty Notifications Start End  Natalia Leatherwood, DO PCP - General Family Medicine  10/02/16   Francee Gentile, MD  Internal Medicine  10/02/16    Comment: rheumatologist- SLE    Chief Complaint  Patient presents with  . Follow-up    CMC; pt is not fasting     Subjective: Monica Schwartz is a 69 y.o.  Female  present for  Essential hypertension/HLD:  Pt reports compliance with lisinopril 5 mg QD. Patient denies chest pain, shortness of breath, dizziness or lower extremity edema.  Pt takes a daily baby ASA. Pt is not prescribed statin.  Diet: low sodium Exercise: routinely RF: HTN, HLD, Lupus, FHX stroke  Depression/anxiety:  Pt reports she is feeling well with her depresison and anxiety. She is seeing Dr.Karr- which is prescribing her cymbalta. She has continue the  Trazodone 200 mg  nightly.  She discontinue lexapro and paxil with start of cymbalta.  Systemic lupus erythematosus, unspecified SLE type, unspecified organ involvement status (HCC) Followed by rheumatology. She is prescribed tramadol TID PRN for pain, supplied by her rheumatologist. She is also prescribed MTX, plaquenil and prednisone.     Asthma: continues to use albuterol prn. Needs only occasionally.   B12/vit d def: she is supplementing. She did see some improvement with supplement in her fatigue sx.   Depression screen [facility] 2/9 05/31/2020 12/10/2019 03/24/2019 09/18/2018 03/20/2018  Decreased Interest 3 1 2 1  -  Down, Depressed, Hopeless 1 1 2 1  0  PHQ - 2 Score 4 2 4 2  0  Altered sleeping 0 2 2 0 0  Tired, decreased energy 0 3 3 1  0  Change in appetite 0 1 3 1  0  Feeling bad or failure about yourself  1 0 2 1 0  Trouble concentrating 2 0 2 0 0  Moving slowly or fidgety/restless 0 1 2 0 0  Suicidal thoughts 0 1 2 0 0  PHQ-9 Score 7 10 20 5  0  Difficult doing work/chores - Very difficult  Extremely dIfficult Not difficult at all Not difficult at all   GAD 7 : Generalized Anxiety Score 03/24/2019 09/18/2018 03/20/2018 03/20/2018  Nervous, Anxious, on Edge 3 1 - 0  Control/stop worrying 3 1 - 0  Worry too much - different things 3 1 - 0  Trouble relaxing 3 1 - 0  Restless 2 1 - 0  Easily annoyed or irritable 2 1 - 0  Afraid - awful might happen 3 1 0 -  Total GAD 7 Score 19 7 - -  Anxiety Difficulty Extremely difficult Not difficult at all Not difficult at all Not difficult at all    Immunization History  Administered Date(s) Administered  . Fluad Quad(high Dose 65+) 03/24/2019  . Influenza Split 04/05/2016  . Influenza, High Dose Seasonal PF 03/20/2018, 04/14/2020  . Influenza,inj,Quad PF,6+ Mos 03/22/2017  . PFIZER SARS-COV-2 Vaccination 01/05/2020, 01/26/2020  . Pneumococcal Conjugate-13 06/21/2015  . Pneumococcal Polysaccharide-23 09/18/2018  . Tdap 01/25/2014  . Zoster Recombinat (Shingrix) 05/06/2019, 05/06/2019, 09/16/2019    Past Medical History:  Diagnosis Date  . Allergy   . Anemia   . Anxiety   . Asthma   . Colon polyp    benign  . Constipation   . Diverticulosis   . Dysphagia   . Fatigue   . Fatty liver   . GERD (  gastroesophageal reflux disease)   . H/O seasonal allergies   . Hiatal hernia   . History of tremor 09/2013  . Hyperlipidemia   . Hypertension   . Leucocytosis   . Lymphocytosis   . Major depressive disorder    with anxiety  . Microscopic polyangiitis (HCC)   . Migraine   . Myalgia   . Odynophagia   . Osteoarthritis   . Raynauds syndrome   . Systemic lupus (HCC)    + ANA/DS-DNA, anti PR-3ab (neg C-ANCA), anti- cardiolipin IGM. Chronic leukocytosis and mild eosinophilia.    Allergies  Allergen Reactions  . Penicillins Shortness Of Breath and Rash  . Fosamax [Alendronate Sodium]   . Quetiapine Other (See Comments)    Tremor  . Seroquel [Quetiapine Fumarate] Other (See Comments)    Tremor   Past Surgical History:    Procedure Laterality Date  . ABDOMINAL HYSTERECTOMY  1989   with removal of ovaries  . APPENDECTOMY  1989  . CESAREAN SECTION  1984  . COLONOSCOPY  2014  . LAPAROSCOPIC ABDOMINAL EXPLORATION     x2 for endometrosis 1980 and 1984  . TONSILLECTOMY AND ADENOIDECTOMY  1970   Family History  Problem Relation Age of Onset  . Stroke Mother   . Arthritis Mother   . Depression Mother   . Alcohol abuse Father   . COPD Father   . Hearing loss Father   . Arthritis Brother   . Depression Brother   . Parkinson's disease Brother   . Depression Son   . Breast cancer Paternal Aunt   . Arthritis Paternal Aunt    Social History   Social History Narrative   Recently widowed March 2018. Has 2 children, one living named [place].   She is retired Sales promotion account executive.   She drinks caffeine. Takes a daily vitamin.   Wears her seatbelt, wears a bicycle helmet.   Routinely exercises.   Smoke detector in the home.   Feels safe in her relationships.    Allergies as of 05/31/2020      Reactions   Penicillins Shortness Of Breath, Rash   Fosamax [alendronate Sodium]    Quetiapine Other (See Comments)   Tremor   Seroquel [quetiapine Fumarate] Other (See Comments)   Tremor      Medication List       Accurate as of May 31, 2020 10:39 AM. If you have any questions, ask your nurse or doctor.        STOP taking these medications   escitalopram 20 MG tablet Commonly known as: LEXAPRO Stopped by: Felix Pacini, DO   Methotrexate 25 MG/ML Sosy Stopped by: Felix Pacini, DO   PARoxetine 20 MG tablet Commonly known as: Paxil Stopped by: Felix Pacini, DO   traMADol 50 MG tablet Commonly known as: ULTRAM Stopped by: Felix Pacini, DO     TAKE these medications   albuterol 108 (90 Base) MCG/ACT inhaler Commonly known as: VENTOLIN HFA Inhale 2 puffs into the lungs every 6 (six) hours as needed for wheezing or shortness of breath.   aspirin EC 81 MG tablet Take 81 mg by  mouth.   Claritin-D 24 Hour 10-240 MG 24 hr tablet Generic drug: loratadine-pseudoephedrine Take 1 tablet by mouth daily.   colchicine 0.6 MG tablet 0.6 mg. PRN   DULoxetine 60 MG capsule Commonly known as: CYMBALTA Take by mouth. What changed: Another medication with the same name was removed. Continue taking this medication, and follow the directions you see here. Changed  by: Felix Pacini, DO   folic acid 1 MG tablet Commonly known as: FOLVITE Take 1 tablet by mouth daily.   gabapentin 100 MG capsule Commonly known as: NEURONTIN Take 100 mg by mouth daily.   HYDROcodone-acetaminophen 10-325 MG tablet Commonly known as: NORCO Take 1 tablet by mouth 3 (three) times daily as needed.   hydroxychloroquine 200 MG tablet Commonly known as: PLAQUENIL Take 1.5 tablets by mouth daily.   Iron 325 (65 Fe) MG Tabs Take 1 tablet by mouth daily.   lisinopril 5 MG tablet Commonly known as: ZESTRIL Take 1 tablet (5 mg total) by mouth daily.   methotrexate (PF) 50 MG/2ML injection   predniSONE 5 MG tablet Commonly known as: DELTASONE Take 5 mg by mouth daily.   sulfaSALAzine 500 MG tablet Commonly known as: AZULFIDINE Take by mouth at bedtime.   traZODone 100 MG tablet Commonly known as: DESYREL Take 2 tablets (200 mg total) by mouth at bedtime as needed for sleep. What changed: how much to take Changed by: Felix Pacini, DO   triamcinolone 0.1 % Commonly known as: KENALOG APPLY AND RUB IN A THIN FILM TO AFFECTED AREAS TWICE DAILY.(AM AND PM).       All past medical history, surgical history, allergies, family history, immunizations andmedications were updated in the EMR today and reviewed under the history and medication portions of their EMR.     No results found for this or any previous visit (from the past 2160 hour(s)).    ROS: 14 pt review of systems performed and negative (unless mentioned in an HPI)  Objective: BP 102/69   Pulse (!) 115   Temp 98.6 F  (37 C) (Oral)   Ht 5' (1.524 m)   Wt 148 lb (67.1 kg)   SpO2 98%   BMI 28.90 kg/m  Gen: Afebrile. No acute distress. Nontoxic, pleasant female.  HENT: AT. [place].  Eyes:Pupils Equal Round Reactive to light, Extraocular movements intact,  Conjunctiva without redness, discharge or icterus. CV: RRR no murmur, no edema, +2/4 P posterior tibialis pulses Chest: CTAB, no wheeze or crackles Abd: Soft. NTND. BS presentn. o Masses palpated.  Skin: no rashes, purpura or petechiae.  Neuro:  Normal gait. PERLA. EOMi. Alert. Oriented x3  Psych: Normal affect, dress and demeanor. Normal speech. Normal thought content and judgment..    No exam data present  Assessment/plan: JUNI GLAAB is a 69 y.o. female present for CPE Essential hypertension/HLD -stable.  continue lisinopril to 5 mg QD - continue ASA 81 - low sodium diet. Exercise.   Systemic lupus erythematosus, unspecified SLE type, unspecified organ involvement status (HCC) Followed by Rheumatology, continue.   Severe episode of recurrent major depressive disorder, without psychotic features (HCC)/Anxiety - stable.  - continue cymblata prescribed by Dr. Westley Chandler.  -continue e trazodone 200 mg QHS - F/U 5.5 months, sooner if needed  Asthma:  Stable.  continue albuterol as needed  Breast cancer screen:  Mammogram ordered today-pt overdue  Orders Placed This Encounter  Procedures  . MM 3D SCREEN BREAST BILATERAL  . Vitamin D (25 hydroxy)  . B12 and Folate Panel   Meds ordered this encounter  Medications  . traZODone (DESYREL) 100 MG tablet    Sig: Take 2 tablets (200 mg total) by mouth at bedtime as needed for sleep.    Dispense:  180 tablet    Refill:  1  . DISCONTD: lisinopril (ZESTRIL) 5 MG tablet    Sig: Take 1 tablet (5 mg total) by  mouth daily.    Dispense:  90 tablet    Refill:  1  . albuterol (VENTOLIN HFA) 108 (90 Base) MCG/ACT inhaler    Sig: Inhale 2 puffs into the lungs every 6 (six) hours as needed for  wheezing or shortness of breath.    Dispense:  6.7 g    Refill:  11  . lisinopril (ZESTRIL) 5 MG tablet    Sig: Take 1 tablet (5 mg total) by mouth daily.    Dispense:  90 tablet    Refill:  1   Referral Orders  No referral(s) requested today    Electronically signed by: Felix Pacinienee Adriane Guglielmo, DO [facility] [place]

## 2020-06-01 ENCOUNTER — Other Ambulatory Visit: Payer: Self-pay | Admitting: Family Medicine

## 2020-06-01 MED ORDER — CHOLECALCIFEROL 50 MCG (2000 UT) PO TABS: ORAL_TABLET | ORAL | 3 refills | Status: AC

## 2020-06-01 MED ORDER — CYANOCOBALAMIN 1000 MCG SL SUBL: SUBLINGUAL_TABLET | SUBLINGUAL | 3 refills | Status: AC

## 2020-06-09 ENCOUNTER — Other Ambulatory Visit: Payer: Self-pay | Admitting: Family Medicine

## 2020-06-09 DIAGNOSIS — F419 Anxiety disorder, unspecified: Secondary | ICD-10-CM

## 2020-06-09 DIAGNOSIS — F332 Major depressive disorder, recurrent severe without psychotic features: Secondary | ICD-10-CM

## 2020-06-14 ENCOUNTER — Other Ambulatory Visit: Payer: Self-pay | Admitting: Family Medicine

## 2020-06-14 DIAGNOSIS — F419 Anxiety disorder, unspecified: Secondary | ICD-10-CM

## 2020-06-14 DIAGNOSIS — F332 Major depressive disorder, recurrent severe without psychotic features: Secondary | ICD-10-CM

## 2020-06-16 DIAGNOSIS — M13842 Other specified arthritis, left hand: Secondary | ICD-10-CM | POA: Diagnosis not present

## 2020-06-16 DIAGNOSIS — M79644 Pain in right finger(s): Secondary | ICD-10-CM | POA: Diagnosis not present

## 2020-06-16 DIAGNOSIS — M13841 Other specified arthritis, right hand: Secondary | ICD-10-CM | POA: Diagnosis not present

## 2020-06-16 DIAGNOSIS — M79645 Pain in left finger(s): Secondary | ICD-10-CM | POA: Diagnosis not present

## 2020-07-28 DIAGNOSIS — M329 Systemic lupus erythematosus, unspecified: Secondary | ICD-10-CM | POA: Diagnosis not present

## 2020-07-28 DIAGNOSIS — M5416 Radiculopathy, lumbar region: Secondary | ICD-10-CM | POA: Diagnosis not present

## 2020-07-28 DIAGNOSIS — M180 Bilateral primary osteoarthritis of first carpometacarpal joints: Secondary | ICD-10-CM | POA: Diagnosis not present

## 2020-07-28 DIAGNOSIS — M2550 Pain in unspecified joint: Secondary | ICD-10-CM | POA: Diagnosis not present

## 2020-07-28 DIAGNOSIS — Z79899 Other long term (current) drug therapy: Secondary | ICD-10-CM | POA: Diagnosis not present

## 2020-08-09 DIAGNOSIS — M5136 Other intervertebral disc degeneration, lumbar region: Secondary | ICD-10-CM | POA: Diagnosis not present

## 2020-08-25 DIAGNOSIS — M545 Low back pain, unspecified: Secondary | ICD-10-CM | POA: Diagnosis not present

## 2020-09-05 DIAGNOSIS — M5416 Radiculopathy, lumbar region: Secondary | ICD-10-CM | POA: Diagnosis not present

## 2020-09-14 ENCOUNTER — Ambulatory Visit: Payer: Medicare Other

## 2020-09-27 DIAGNOSIS — M5416 Radiculopathy, lumbar region: Secondary | ICD-10-CM | POA: Diagnosis not present

## 2020-10-12 ENCOUNTER — Ambulatory Visit: Payer: Medicare Other

## 2020-10-12 NOTE — Progress Notes (Deleted)
Subjective:   LUVADA SALAMONE is a 70 y.o. female who presents for an Initial Medicare Annual Wellness Visit.  Review of Systems    ***       Objective:    There were no vitals filed for this visit. There is no height or weight on file to calculate BMI.  No flowsheet data found.  Current Medications (verified) Outpatient Encounter Medications as of 10/12/2020  Medication Sig  . albuterol (VENTOLIN HFA) 108 (90 Base) MCG/ACT inhaler Inhale 2 puffs into the lungs every 6 (six) hours as needed for wheezing or shortness of breath.  Marland Kitchen aspirin EC 81 MG tablet Take 81 mg by mouth.  . Cholecalciferol 50 MCG (2000 UT) TABS 1 tab daily with food  . CLARITIN-D 24 HOUR 10-240 MG 24 hr tablet Take 1 tablet by mouth daily.  . colchicine 0.6 MG tablet 0.6 mg. PRN  . Cyanocobalamin 1000 MCG SUBL 1000 mcg sublingual daily  . DULoxetine (CYMBALTA) 60 MG capsule Take by mouth.  . Ferrous Sulfate (IRON) 325 (65 Fe) MG TABS Take 1 tablet by mouth daily.  . folic acid (FOLVITE) 1 MG tablet Take 1 tablet by mouth daily.  Marland Kitchen gabapentin (NEURONTIN) 100 MG capsule Take 100 mg by mouth daily.  Marland Kitchen HYDROcodone-acetaminophen (NORCO) 10-325 MG tablet Take 1 tablet by mouth 3 (three) times daily as needed.  . hydroxychloroquine (PLAQUENIL) 200 MG tablet Take 1.5 tablets by mouth daily.  Marland Kitchen lisinopril (ZESTRIL) 5 MG tablet Take 1 tablet (5 mg total) by mouth daily.  . Methotrexate Sodium (METHOTREXATE, PF,) 50 MG/2ML injection   . predniSONE (DELTASONE) 5 MG tablet Take 5 mg by mouth daily.  Marland Kitchen sulfaSALAzine (AZULFIDINE) 500 MG tablet Take by mouth at bedtime.  . traZODone (DESYREL) 100 MG tablet Take 2 tablets (200 mg total) by mouth at bedtime as needed for sleep.  Marland Kitchen triamcinolone cream (KENALOG) 0.1 % APPLY AND RUB IN A THIN FILM TO AFFECTED AREAS TWICE DAILY.(AM AND PM).   No facility-administered encounter medications on file as of 10/12/2020.    Allergies (verified) Penicillins, Fosamax [alendronate  sodium], Quetiapine, and Seroquel [quetiapine fumarate]   History: Past Medical History:  Diagnosis Date  . Allergy   . Anemia   . Anxiety   . Asthma   . Colon polyp    benign  . Constipation   . Diverticulosis   . Dysphagia   . Fatigue   . Fatty liver   . GERD (gastroesophageal reflux disease)   . H/O seasonal allergies   . Hiatal hernia   . History of tremor 09/2013  . Hyperlipidemia   . Hypertension   . Leucocytosis   . Lymphocytosis   . Major depressive disorder    with anxiety  . Microscopic polyangiitis (HCC)   . Migraine   . Myalgia   . Odynophagia   . Osteoarthritis   . Raynauds syndrome   . Systemic lupus (HCC)    + ANA/DS-DNA, anti PR-3ab (neg C-ANCA), anti- cardiolipin IGM. Chronic leukocytosis and mild eosinophilia.    Past Surgical History:  Procedure Laterality Date  . ABDOMINAL HYSTERECTOMY  1989   with removal of ovaries  . APPENDECTOMY  1989  . CESAREAN SECTION  1984  . COLONOSCOPY  2014  . LAPAROSCOPIC ABDOMINAL EXPLORATION     x2 for endometrosis 1980 and 1984  . TONSILLECTOMY AND ADENOIDECTOMY  1970   Family History  Problem Relation Age of Onset  . Stroke Mother   . Arthritis Mother   .  Depression Mother   . Alcohol abuse Father   . COPD Father   . Hearing loss Father   . Arthritis Brother   . Depression Brother   . Parkinson's disease Brother   . Depression Son   . Breast cancer Paternal Aunt   . Arthritis Paternal Aunt    Social History   Socioeconomic History  . Marital status: Widowed    Spouse name: Not on file  . Number of children: 1  . Years of education: 56  . Highest education level: Not on file  Occupational History  . Occupation: retired  Tobacco Use  . Smoking status: Former Smoker    Packs/day: 0.50    Years: 10.00    Pack years: 5.00  . Smokeless tobacco: Never Used  Vaping Use  . Vaping Use: Never used  Substance and Sexual Activity  . Alcohol use: Yes    Comment: occasional  . Drug use: No  .  Sexual activity: Never  Other Topics Concern  . Not on file  Social History Narrative   Recently widowed March 2018. Has 2 children, one living named [place].   She is retired Sales promotion account executive.   She drinks caffeine. Takes a daily vitamin.   Wears her seatbelt, wears a bicycle helmet.   Routinely exercises.   Smoke detector in the home.   Feels safe in her relationships.   Social Determinants of Health   Financial Resource Strain: Not on file  Food Insecurity: Not on file  Transportation Needs: Not on file  Physical Activity: Not on file  Stress: Not on file  Social Connections: Not on file    Tobacco Counseling Counseling given: Not Answered   Clinical Intake:                 Diabetic?No         Activities of Daily Living In your present state of health, do you have any difficulty performing the following activities: 12/10/2019  Hearing? N  Vision? Y  Comment medication affecting vision  Difficulty concentrating or making decisions? Y  Walking or climbing stairs? Y  Dressing or bathing? N  Doing errands, shopping? Y  Comment does not go anywhere alone  Some recent data might be hidden    Patient Care Team: Natalia Leatherwood, DO as PCP - General (Family Medicine) Francee Gentile, MD (Internal Medicine)  Indicate any recent Medical Services you may have received from other than [facility] providers in the past year (date may be approximate).     Assessment:   This is a routine wellness examination for [facility].  Hearing/Vision screen No exam data present  Dietary issues and exercise activities discussed:    Goals   None    Depression Screen PHQ 2/9 Scores 05/31/2020 12/10/2019 03/24/2019 09/18/2018 03/20/2018 09/17/2017 03/22/2017  PHQ - 2 Score 4 2 4 2  0 2 1  PHQ- 9 Score 7 10 20 5  0 9 4    Fall Risk Fall Risk  05/31/2020 03/24/2019 11/26/2017 09/17/2017  Falls in the past year? 0 0 Yes Yes  Number falls in past yr: 0 0 2 or more -   Injury with Fall? 0 0 Yes Yes  Risk Factor Category  - - High Fall Risk -  Risk for fall due to : - - History of fall(s);Other (Comment) -  Risk for fall due to: Comment - - vertigo -  Follow up Falls evaluation completed Falls evaluation completed - -    FALL RISK  PREVENTION PERTAINING TO THE HOME:  Any stairs in or around the home? {YES/NO:21197} If so, are there any without handrails? {YES/NO:21197} Home free of loose throw rugs in walkways, pet beds, electrical cords, etc? {YES/NO:21197} Adequate lighting in your home to reduce risk of falls? {YES/NO:21197}  ASSISTIVE DEVICES UTILIZED TO PREVENT FALLS:  Life alert? {YES/NO:21197} Use of a cane, walker or w/c? {YES/NO:21197} Grab bars in the bathroom? {YES/NO:21197} Shower chair or bench in shower? {YES/NO:21197} Elevated toilet seat or a handicapped toilet? {YES/NO:21197}  TIMED UP AND GO:  Was the test performed? {YES/NO:21197}.  Length of time to ambulate 10 feet: *** sec.   {Appearance of UXLK:4401027}  Cognitive Function:        Immunizations Immunization History  Administered Date(s) Administered  . Fluad Quad(high Dose 65+) 03/24/2019  . Influenza Split 04/05/2016  . Influenza, High Dose Seasonal PF 03/20/2018, 04/14/2020  . Influenza,inj,Quad PF,6+ Mos 03/22/2017  . PFIZER(Purple Top)SARS-COV-2 Vaccination 01/05/2020, 01/26/2020  . Pneumococcal Conjugate-13 06/21/2015  . Pneumococcal Polysaccharide-23 09/18/2018  . Tdap 01/25/2014  . Zoster Recombinat (Shingrix) 05/06/2019, 05/06/2019, 09/16/2019    TDAP status: Up to date  Flu Vaccine status: Up to date  Pneumococcal vaccine status: Up to date  {Covid-19 vaccine status:2101808}  Qualifies for Shingles Vaccine? No   Zostavax completed No   Shingrix Completed?: Yes  Screening Tests Health Maintenance  Topic Date Due  . MAMMOGRAM  Never done  . COVID-19 Vaccine (3 - Booster for Pfizer series) 07/28/2020  . INFLUENZA VACCINE  01/30/2021  .  COLONOSCOPY (Pts 45-39yrs Insurance coverage will need to be confirmed)  07/02/2022  . TETANUS/TDAP  01/26/2024  . DEXA SCAN  09/06/2027  . Hepatitis C Screening  Completed  . PNA vac Low Risk Adult  Completed  . HPV VACCINES  Aged Out    Health Maintenance  Health Maintenance Due  Topic Date Due  . MAMMOGRAM  Never done  . COVID-19 Vaccine (3 - Booster for Pfizer series) 07/28/2020    Colorectal cancer screening: Type of screening: Colonoscopy. Completed 07/02/2012. Repeat every 10 years  {Mammogram status:21018020}  {Bone Density status:21018021}  Lung Cancer Screening: (Low Dose CT Chest recommended if Age 50-80 years, 30 pack-year currently smoking OR have quit w/in 15years.) does not qualify.    Additional Screening:  Hepatitis C Screening: Completed 07/02/2014  Vision Screening: Recommended annual ophthalmology exams for early detection of glaucoma and other disorders of the eye. Is the patient up to date with their annual eye exam?  {YES/NO:21197} Who is the provider or what is the name of the office in which the patient attends annual eye exams? *** If pt is not established with a provider, would they like to be referred to a provider to establish care? {YES/NO:21197}.   Dental Screening: Recommended annual dental exams for proper oral hygiene  Community Resource Referral / Chronic Care Management: CRR required this visit?  {YES/NO:21197}  CCM required this visit?  {YES/NO:21197}     Plan:     I have personally reviewed and noted the following in the patient's chart:   . Medical and social history . Use of alcohol, tobacco or illicit drugs  . Current medications and supplements . Functional ability and status . Nutritional status . Physical activity . Advanced directives . List of other physicians . Hospitalizations, surgeries, and ER visits in previous 12 months . Vitals . Screenings to include cognitive, depression, and falls . Referrals and  appointments  In addition, I have reviewed and discussed with patient  certain preventive protocols, quality metrics, and best practice recommendations. A written personalized care plan for preventive services as well as general preventive health recommendations were provided to patient.     Roanna RaiderMartha A Yeslin Delio, LPN   1/61/09604/13/2022  Nurse Health Advisor  Nurse Notes: None

## 2020-11-16 DIAGNOSIS — M5136 Other intervertebral disc degeneration, lumbar region: Secondary | ICD-10-CM | POA: Diagnosis not present

## 2020-11-16 DIAGNOSIS — M5412 Radiculopathy, cervical region: Secondary | ICD-10-CM | POA: Diagnosis not present

## 2020-11-23 DIAGNOSIS — Z79899 Other long term (current) drug therapy: Secondary | ICD-10-CM | POA: Diagnosis not present

## 2020-11-23 DIAGNOSIS — M329 Systemic lupus erythematosus, unspecified: Secondary | ICD-10-CM | POA: Diagnosis not present

## 2021-01-19 DIAGNOSIS — M5412 Radiculopathy, cervical region: Secondary | ICD-10-CM | POA: Diagnosis not present

## 2021-01-31 DIAGNOSIS — M2550 Pain in unspecified joint: Secondary | ICD-10-CM | POA: Diagnosis not present

## 2021-01-31 DIAGNOSIS — M329 Systemic lupus erythematosus, unspecified: Secondary | ICD-10-CM | POA: Diagnosis not present

## 2021-01-31 DIAGNOSIS — Z79899 Other long term (current) drug therapy: Secondary | ICD-10-CM | POA: Diagnosis not present

## 2021-02-03 ENCOUNTER — Other Ambulatory Visit: Payer: Self-pay

## 2021-02-03 ENCOUNTER — Ambulatory Visit (INDEPENDENT_AMBULATORY_CARE_PROVIDER_SITE_OTHER): Payer: Medicare Other | Admitting: Family Medicine

## 2021-02-03 VITALS — BP 108/72 | HR 98 | Temp 98.0°F | Ht 60.0 in | Wt 151.0 lb

## 2021-02-03 DIAGNOSIS — H9193 Unspecified hearing loss, bilateral: Secondary | ICD-10-CM

## 2021-02-03 DIAGNOSIS — R2681 Unsteadiness on feet: Secondary | ICD-10-CM

## 2021-02-03 DIAGNOSIS — H5509 Other forms of nystagmus: Secondary | ICD-10-CM

## 2021-02-03 DIAGNOSIS — R270 Ataxia, unspecified: Secondary | ICD-10-CM

## 2021-02-03 DIAGNOSIS — R42 Dizziness and giddiness: Secondary | ICD-10-CM

## 2021-02-03 DIAGNOSIS — M329 Systemic lupus erythematosus, unspecified: Secondary | ICD-10-CM

## 2021-02-03 HISTORY — DX: Unsteadiness on feet: R26.81

## 2021-02-03 LAB — B12 AND FOLATE PANEL
Folate: >24.4 ng/mL (ref >5.9)
Vitamin B-12: 683 pg/mL (ref 211–911)

## 2021-02-03 LAB — CBC WITH DIFFERENTIAL/PLATELET
Basophils Absolute: 0.1 10*3/uL (ref 0.0–0.1)
Basophils Relative: 0.9 % (ref 0.0–3.0)
Eosinophils Absolute: 0.5 10*3/uL (ref 0.0–0.7)
Eosinophils Relative: 3.9 % (ref 0.0–5.0)
HCT: 41.3 % (ref 36.0–46.0)
Hemoglobin: 13.3 g/dL (ref 12.0–15.0)
Lymphocytes Relative: 21.1 % (ref 12.0–46.0)
Lymphs Abs: 2.5 10*3/uL (ref 0.7–4.0)
MCHC: 32.2 g/dL (ref 30.0–36.0)
MCV: 98.4 fl (ref 78.0–100.0)
Monocytes Absolute: 0.8 10*3/uL (ref 0.1–1.0)
Monocytes Relative: 6.8 % (ref 3.0–12.0)
Neutro Abs: 8.1 10*3/uL — ABNORMAL HIGH (ref 1.4–7.7)
Neutrophils Relative %: 67.3 % (ref 43.0–77.0)
Platelets: 312 10*3/uL (ref 150.0–400.0)
RBC: 4.2 Mil/uL (ref 3.87–5.11)
RDW: 13.3 % (ref 11.5–15.5)
WBC: 12 10*3/uL — ABNORMAL HIGH (ref 4.0–10.5)

## 2021-02-03 LAB — BASIC METABOLIC PANEL
BUN: 16 mg/dL (ref 6–23)
CO2: 24 mEq/L (ref 19–32)
Calcium: 9.1 mg/dL (ref 8.4–10.5)
Chloride: 103 mEq/L (ref 96–112)
Creatinine, Ser: 0.71 mg/dL (ref 0.40–1.20)
GFR: 87.23 mL/min (ref >60.00)
Glucose, Bld: 111 mg/dL — ABNORMAL HIGH (ref 70–99)
Potassium: 3.7 mEq/L (ref 3.5–5.1)
Sodium: 138 mEq/L (ref 135–145)

## 2021-02-03 LAB — TSH: TSH: 0.39 u[IU]/mL (ref 0.35–5.50)

## 2021-02-03 MED ORDER — FLUTICASONE PROPIONATE 50 MCG/ACT NA SUSP: 2.0000 | Freq: Every day | NASAL | 6 refills | Status: DC

## 2021-02-03 MED ORDER — DOXYCYCLINE HYCLATE 100 MG PO TABS: 100.0000 mg | ORAL_TABLET | Freq: Two times a day (BID) | ORAL | 0 refills | Status: DC

## 2021-02-03 MED ORDER — PREDNISONE 20 MG PO TABS: ORAL_TABLET | ORAL | 0 refills | Status: DC

## 2021-02-03 NOTE — Progress Notes (Signed)
This visit occurred during the SARS-CoV-2 public health emergency.  Safety protocols were in place, including screening questions prior to the visit, additional usage of staff PPE, and extensive cleaning of exam room while observing appropriate contact time as indicated for disinfecting solutions.    Monica Schwartz , 1951-03-27, 70 y.o., female MRN: [id number] Patient Care Team    Relationship Specialty Notifications Start End  Natalia Leatherwood, DO PCP - General Family Medicine  10/02/16   Francee Gentile, MD  Internal Medicine  10/02/16    Comment: rheumatologist- SLE    Chief Complaint  Patient presents with   Ear Pain    Right; Left ear clogged and hurts as well. Has not used any otc ear drops. Currently experiencing dizziness      Subjective: Pt presents for an OV with complaints of decreased hearing out of her ears.  She has noticed they are uncomfortable as well.  She endorses feeling dizziness such as room spinning.  She reports she feels her eyes not focusing correctly and sometimes will have double vision.  She denies lightheadedness or syncope.  Reports 2 days ago she felt like she lost all hearing from her left ear. She does endorse hearing a buzzing at times in her left ear. She has difficulty with her balance.  She reports her friend has had to walk next to her to keep her walking in a straight line, she commonly veers off laterally.  She reports the dizziness and difficulty focusing her eyes occurs mostly when she turns her head or stands up, but she is feeling unstable on her feet all the time.  She reports the symptoms have been present for more than 3 months but are worsening. She denies headache, nausea, vomit, fever, night sweats or unintentional weight loss.   Depression screen [facility] 2/9 05/31/2020 12/10/2019 03/24/2019 09/18/2018 03/20/2018  Decreased Interest 3 1 2 1  -  Down, Depressed, Hopeless 1 1 2 1  0  PHQ - 2 Score 4 2 4 2  0  Altered sleeping 0 2 2 0 0  Tired,  decreased energy 0 3 3 1  0  Change in appetite 0 1 3 1  0  Feeling bad or failure about yourself  1 0 2 1 0  Trouble concentrating 2 0 2 0 0  Moving slowly or fidgety/restless 0 1 2 0 0  Suicidal thoughts 0 1 2 0 0  PHQ-9 Score 7 10 20 5  0  Difficult doing work/chores - Very difficult Extremely dIfficult Not difficult at all Not difficult at all    Allergies  Allergen Reactions   Penicillins Shortness Of Breath, Rash and Swelling   Fosamax [Alendronate Sodium]    Quetiapine Other (See Comments)    Tremor   Seroquel [Quetiapine Fumarate] Other (See Comments)    Tremor   Social History   Social History Narrative   Recently widowed March 2018. Has 2 children, one living named Dunean.   She is retired .   She drinks caffeine. Takes a daily vitamin.   Wears her seatbelt, wears a bicycle helmet.   Routinely exercises.   Smoke detector in the home.   Feels safe in her relationships.   Past Medical History:  Diagnosis Date   Allergy    Anemia    Anxiety    Asthma    Colon polyp    benign   Constipation    Diverticulosis    Dysphagia    Fatigue    Fatty  liver    GERD (gastroesophageal reflux disease)    H/O seasonal allergies    Hiatal hernia    History of tremor 09/2013   Hyperlipidemia    Hypertension    Leucocytosis    Lymphocytosis    Major depressive disorder    with anxiety   Microscopic polyangiitis (HCC)    Migraine    Myalgia    Odynophagia    Osteoarthritis    Raynauds syndrome    Systemic lupus (HCC)    + ANA/DS-DNA, anti PR-3ab (neg C-ANCA), anti- cardiolipin IGM. Chronic leukocytosis and mild eosinophilia.    Past Surgical History:  Procedure Laterality Date   ABDOMINAL HYSTERECTOMY  1989   with removal of ovaries   APPENDECTOMY  1989   CESAREAN SECTION  1984   COLONOSCOPY  2014   LAPAROSCOPIC ABDOMINAL EXPLORATION     x2 for endometrosis 1980 and 1984   TONSILLECTOMY AND ADENOIDECTOMY  1970   Family History   Problem Relation Age of Onset   Stroke Mother    Arthritis Mother    Depression Mother    Alcohol abuse Father    COPD Father    Hearing loss Father    Arthritis Brother    Depression Brother    Parkinson's disease Brother    Depression Son    Breast cancer Paternal Aunt    Arthritis Paternal Aunt    Allergies as of 02/03/2021       Reactions   Penicillins Shortness Of Breath, Rash, Swelling   Fosamax [alendronate Sodium]    Quetiapine Other (See Comments)   Tremor   Seroquel [quetiapine Fumarate] Other (See Comments)   Tremor        Medication List        Accurate as of February 03, 2021  1:08 PM. If you have any questions, ask your nurse or doctor.          STOP taking these medications    colchicine 0.6 MG tablet Stopped by: Felix Pacini, DO   HYDROcodone-acetaminophen 10-325 MG tablet Commonly known as: NORCO Stopped by: Felix Pacini, DO   methotrexate (PF) 50 MG/2ML injection Stopped by: Felix Pacini, DO       TAKE these medications    albuterol 108 (90 Base) MCG/ACT inhaler Commonly known as: VENTOLIN HFA Inhale 2 puffs into the lungs every 6 (six) hours as needed for wheezing or shortness of breath.   aspirin EC 81 MG tablet Take 81 mg by mouth.   Cholecalciferol 50 MCG (2000 UT) Tabs 1 tab daily with food   Claritin-D 24 Hour 10-240 MG 24 hr tablet Generic drug: loratadine-pseudoephedrine Take 1 tablet by mouth daily.   Cyanocobalamin 1000 MCG Subl 1000 mcg sublingual daily   doxycycline 100 MG tablet Commonly known as: VIBRA-TABS Take 1 tablet (100 mg total) by mouth 2 (two) times daily. Started by: Felix Pacini, DO   DULoxetine 60 MG capsule Commonly known as: CYMBALTA Take by mouth.   fluticasone 50 MCG/ACT nasal spray Commonly known as: FLONASE Place 2 sprays into both nostrils daily. Started by: Felix Pacini, DO   folic acid 1 MG tablet Commonly known as: FOLVITE Take 1 tablet by mouth daily.   gabapentin 100 MG  capsule Commonly known as: NEURONTIN Take 100 mg by mouth daily.   hydroxychloroquine 200 MG tablet Commonly known as: PLAQUENIL Take 1.5 tablets by mouth daily.   Iron 325 (65 Fe) MG Tabs Take 1 tablet by mouth daily.   lisinopril 5 MG tablet  Commonly known as: ZESTRIL Take 1 tablet (5 mg total) by mouth daily.   PARoxetine 20 MG tablet Commonly known as: PAXIL Paxil 20 mg tablet   20 mg by oral route.   predniSONE 5 MG tablet Commonly known as: DELTASONE Take 5 mg by mouth daily. What changed: Another medication with the same name was added. Make sure you understand how and when to take each. Changed by: Felix Pacini, DO   predniSONE 20 MG tablet Commonly known as: DELTASONE 60 mg x3d, 40 mg x3d, 20 mg x2d, 10 mg x2d What changed: You were already taking a medication with the same name, and this prescription was added. Make sure you understand how and when to take each. Changed by: Felix Pacini, DO   sulfaSALAzine 500 MG tablet Commonly known as: AZULFIDINE Take by mouth at bedtime.   traZODone 100 MG tablet Commonly known as: DESYREL Take 2 tablets (200 mg total) by mouth at bedtime as needed for sleep.   triamcinolone cream 0.1 % Commonly known as: KENALOG APPLY AND RUB IN A THIN FILM TO AFFECTED AREAS TWICE DAILY.(AM AND PM).        All past medical history, surgical history, allergies, family history, immunizations andmedications were updated in the EMR today and reviewed under the history and medication portions of their EMR.     ROS: Negative, with the exception of above mentioned in HPI   Objective:  BP 108/72   Pulse 98   Temp 98 F (36.7 C)   Ht 5' (1.524 m)   Wt 151 lb (68.5 kg)   SpO2 98%   BMI 29.49 kg/m  Body mass index is 29.49 kg/m. Gen: Afebrile. No acute distress. Nontoxic in appearance, well developed, well nourished.  HENT: AT. [place]. Bilateral TM visualized without erythema or bulging. MMM, no oral lesions. Bilateral nares with mild  erythema, mild drainage, no swelling. Throat without erythema or exudates.  No cough.  No hoarseness.  Tender to palpation maxillary sinus right. Eyes:Pupils Equal Round Reactive to light, Extraocular movements intact,  Conjunctiva without redness, discharge or icterus. Skin: no rashes, purpura or petechiae.  Neuro: Normal gait. PERLA. EOMi. Alert. Oriented x3 Cranial nerves II through XII intact. Muscle strength 5/5 upper and lower extremity.  Negative pronator drift. Psych: Normal affect, dress and demeanor. Normal speech. Normal thought content and judgment. Dix-Hallpike: Positive for vertical nystagmus right lateral gaze   Assessment/Plan: Monica Schwartz is a 70 y.o. female present for OV for  Bilateral hearing loss, unspecified hearing loss type - Ambulatory referral to ENT  Sinusitis: - predniSONE (DELTASONE) 20 MG tablet; 60 mg x3d, 40 mg x3d, 20 mg x2d, 10 mg x2d  Dispense: 18 tablet; Refill: 0 - doxycycline (VIBRA-TABS) 100 MG tablet; Take 1 tablet (100 mg total) by mouth 2 (two) times daily.  Dispense: 20 tablet; Refill: 0 - fluticasone (FLONASE) 50 MCG/ACT nasal spray; Place 2 sprays into both nostrils daily.  Dispense: 16 g; Refill: 6  Systemic lupus erythematosus, unspecified SLE type, unspecified organ involvement status [facility]) New referral requested by patient to rheumatology, she would like something closer to the [place] area. - Ambulatory referral to Rheumatology  Dizziness/vertical nystagmus/ataxia/gait instability/SLE Patient with reports of ataxia, sometimes needing people to guide her in a straight line when walking.  She has vertical nystagmus with right lateral gaze today.  Concerning for cerebellar etiology or brain lesion. - MR BRAIN W WO CONTRAST; Future - Basic Metabolic Panel (BMET) - CBC w/Diff - TSH - B12  and Folate Panel  Reviewed expectations re: course of current medical issues. Discussed self-management of symptoms. Outlined signs and symptoms  indicating need for more acute intervention. Patient verbalized understanding and all questions were answered. Patient received an After-Visit Summary.    Orders Placed This Encounter  Procedures   MR BRAIN W WO CONTRAST   Basic Metabolic Panel (BMET)   CBC w/Diff   TSH   B12 and Folate Panel   Ambulatory referral to ENT   Ambulatory referral to Rheumatology   Meds ordered this encounter  Medications   predniSONE (DELTASONE) 20 MG tablet    Sig: 60 mg x3d, 40 mg x3d, 20 mg x2d, 10 mg x2d    Dispense:  18 tablet    Refill:  0   doxycycline (VIBRA-TABS) 100 MG tablet    Sig: Take 1 tablet (100 mg total) by mouth 2 (two) times daily.    Dispense:  20 tablet    Refill:  0   fluticasone (FLONASE) 50 MCG/ACT nasal spray    Sig: Place 2 sprays into both nostrils daily.    Dispense:  16 g    Refill:  6   Referral Orders  Ambulatory referral to ENT  Ambulatory referral to Rheumatology     Note is dictated utilizing voice recognition software. Although note has been proof read prior to signing, occasional typographical errors still can be missed. If any questions arise, please do not hesitate to call for verification.   electronically signed by:  Felix Pacinienee Avilene Marrin, DO  [facility] - [place]

## 2021-02-08 ENCOUNTER — Encounter: Payer: Self-pay | Admitting: Family Medicine

## 2021-02-08 NOTE — Patient Instructions (Signed)
Nystagmus Nystagmus is a condition that causes uncontrollable eye movements in all directions. The eye movements can go from side to side (horizontal nystagmus), up and down (vertical nystagmus), or in a circular motion (rotary nystagmus). The movements may come and go, be continual, or happen only in certain positions or with certain head movements. The movements are often rapid, rhythmic, and repetitive. Nystagmus usually affects both eyes and causes visionproblems. You may also feel off-balance or dizzy. There are different types of nystagmus. They are categorized by when they develop. Infantile nystagmus develops in babies who are around 91 months old. Spasmus nutans develops in children who are between age 66 months and 67 years old. Acquired nystagmus occurs in older children and adults. What are the causes? The most common cause of nystagmus is a neurological problem that a baby isborn with or develops shortly after birth. Acquired nystagmus can be caused by a condition that affects the part of the brain that controls eye movement or the part of the inner ear that controls balance. Common disorders that cause nystagmus include: Multiple sclerosis. Brain tumor. Brain injury or infection. Stroke. Inner ear diseases, like Meniere disease, that also cause spinning dizziness. Loss of balance. Alcohol or drug abuse. Some medicines, including medicines that treat bipolar disorder and seizures. Sometimes the cause is not known. What are the signs or symptoms? Symptoms of this condition include: Shaky or wobbly vision. Having to turn your head sideways to see better. Sensitivity to light. Difficulty seeing in the dark. Blurred vision. Loss of depth perception. Vertigo. Dizziness or imbalance. How is this diagnosed? Your health care provider can diagnose nystagmus based on an eye exam. Nystagmus may be diagnosed if your eyes move in any direction, and the movement is rapid, repetitive,  rhythmic, and uncontrolled. You may need additional exams by specialists to rule out other conditions that can cause nystagmus. These may include: An eye exam by an eye specialist (ophthalmologist). An ear exam by an ear, nose, and throat specialist (otolaryngologist). An exam by a brain and nervous system specialist (neurologist). You may also have certain tests at the exams, including imaging studies (like an MRI or CT scan), or a test to measure your eye movements (electronystagmography or ENG). How is this treated? There is no specific treatment for nystagmus. Spasmus nutans often improves without treatment by age 31. Treating the condition that caused nystagmuscan relieve symptoms. Follow these instructions at home:  Take over-the-counter and prescription medicines only as told by your health care provider. Return to your normal activities as told by your health care provider. Ask your health care provider what activities are safe for you Keep all follow-up visits. This is important. Contact a health care provider if: You have any new disturbance in your vision. You have loss of balance. You have dizziness or spinning vertigo. Get help right away if: Your vision problems worsen. You have severe headaches. You faint. Summary Nystagmus is a condition that causes uncontrollable eye movements in all directions. It usually affects both eyes and causes vision problems. The most common cause of nystagmus is a neurological problem that a baby is born with or develops shortly after birth. Nystagmus can also be caused by a condition that affects the part of the brain that controls eye movement or the part of the inner ear that controls balance. There is no specific treatment for nystagmus, but treating another condition that causes nystagmus can relieve symptoms. This information is not intended to replace advice given to you  by your health care provider. Make sure you discuss any questions  you have with your healthcare provider. Document Revised: 03/28/2020 Document Reviewed: 03/28/2020 Elsevier Patient Education  2022 [employer].

## 2021-02-15 ENCOUNTER — Telehealth: Payer: Self-pay

## 2021-02-15 DIAGNOSIS — G894 Chronic pain syndrome: Secondary | ICD-10-CM | POA: Diagnosis not present

## 2021-02-15 NOTE — Telephone Encounter (Signed)
Spoke with pt regarding continued sx. Pt was informed to contact [facility] imaging for MRI to be completed. She denies any new sx but states she has very little improvement. Pt is aware that PCP is out of office for the week and will CB if sx worsen.

## 2021-02-15 NOTE — Telephone Encounter (Signed)
Patient was seen on 02/03/21 by Dr. Claiborne Billings, diagnosed with sinus infection. Patient was placed on antibiotic. Patient has finished up meds, however, she is still congested and feels like sinus infection is still there.  Please advise.

## 2021-02-15 NOTE — Telephone Encounter (Signed)
Pt called back states MRI sched for 8/31

## 2021-02-17 ENCOUNTER — Encounter: Payer: Self-pay | Admitting: Family Medicine

## 2021-02-17 ENCOUNTER — Telehealth (INDEPENDENT_AMBULATORY_CARE_PROVIDER_SITE_OTHER): Payer: Medicare Other | Admitting: Family Medicine

## 2021-02-17 DIAGNOSIS — J329 Chronic sinusitis, unspecified: Secondary | ICD-10-CM | POA: Diagnosis not present

## 2021-02-17 DIAGNOSIS — D849 Immunodeficiency, unspecified: Secondary | ICD-10-CM | POA: Diagnosis not present

## 2021-02-17 DIAGNOSIS — J069 Acute upper respiratory infection, unspecified: Secondary | ICD-10-CM | POA: Diagnosis not present

## 2021-02-17 DIAGNOSIS — J22 Unspecified acute lower respiratory infection: Secondary | ICD-10-CM

## 2021-02-17 MED ORDER — PREDNISONE 20 MG PO TABS: ORAL_TABLET | ORAL | 0 refills | Status: DC

## 2021-02-17 MED ORDER — LEVOFLOXACIN 500 MG PO TABS: 500.0000 mg | ORAL_TABLET | Freq: Every day | ORAL | 0 refills | Status: DC

## 2021-02-17 NOTE — Progress Notes (Signed)
Virtual Visit via Video Note  I connected with Monica Schwartz on 02/17/21 at  4:00 PM EDT by a video enabled telemedicine application and verified that I am speaking with the correct person using two identifiers.  Location patient: home,  Location provider:work or home office Persons participating in the virtual visit: patient, provider  I discussed the limitations of evaluation and management by telemedicine and the availability of in person appointments. The patient expressed understanding and agreed to proceed.  Telemedicine visit is a necessity given the COVID-19 restrictions in place at the current time.  HPI: 70 y/o WF being seen today for respiratory concerns. Recent head congestion, facial pain, ears painful/blocked.  Sx's initially improved but last 4-5d are back.   Also now with lots of cough, productive and deep.  Trouble getting deep breaths in.  Hearing some wheezing when she lays down at night. Temp up to 102 lately.  Fatigue and malaise +.   2 weeks ago-->prednisone taper and antibiotic for sinusitis (see below). No covid testing has been done.  Denies any recent sick contacts.   Saw her PCP Dr. Raoul Pitch 02/03/21, dx vertigo (possibly central lesion), MRI brain scheduled for late this month.  ENT referral ordered.  Sinusitis also dx'd-->prednisone taper and doxy.  ROS as above, plus-->no CP, no SOB,  no cough, no dizziness, no HAs, no rashes, no melena/hematochezia.  No polyuria or polydipsia.  No focal weakness, paresthesias, or tremors.  No acute vision or hearing abnormalities.  No dysuria or unusual/new urinary urgency or frequency.  No recent changes in lower legs. No n/v/d or abd pain.  No palpitations.     Past Medical History:  Diagnosis Date   Allergy    Anemia    Anxiety    Asthma    Colon polyp    benign   Constipation    Diverticulosis    Dysphagia    Fatigue    Fatty liver    GERD (gastroesophageal reflux disease)    H/O seasonal allergies    Hiatal hernia     History of tremor 09/2013   Hyperlipidemia    Hypertension    Leucocytosis    Lymphocytosis    Major depressive disorder    with anxiety   Microscopic polyangiitis (HCC)    Migraine    Myalgia    Odynophagia    Osteoarthritis    Raynauds syndrome    Systemic lupus (HCC)    + ANA/DS-DNA, anti PR-3ab (neg C-ANCA), anti- cardiolipin IGM. Chronic leukocytosis and mild eosinophilia.     Past Surgical History:  Procedure Laterality Date   ABDOMINAL HYSTERECTOMY  1989   with removal of ovaries   APPENDECTOMY  [place]   COLONOSCOPY  2014   LAPAROSCOPIC ABDOMINAL EXPLORATION     x2 for endometrosis 1980 and [place]     Current Outpatient Medications:    albuterol (VENTOLIN HFA) 108 (90 Base) MCG/ACT inhaler, Inhale 2 puffs into the lungs every 6 (six) hours as needed for wheezing or shortness of breath., Disp: 6.7 g, Rfl: 11   aspirin EC 81 MG tablet, Take 81 mg by mouth., Disp: , Rfl:    Cholecalciferol 50 MCG (2000 UT) TABS, 1 tab daily with food, Disp: 90 tablet, Rfl: 3   CLARITIN-D 24 HOUR 10-240 MG 24 hr tablet, Take 1 tablet by mouth daily., Disp: , Rfl: 1   Cyanocobalamin 1000 MCG SUBL, 1000 mcg sublingual daily, Disp: 90  tablet, Rfl: 3   DULoxetine (CYMBALTA) 60 MG capsule, Take by mouth., Disp: , Rfl:    Ferrous Sulfate (IRON) 325 (65 Fe) MG TABS, Take 1 tablet by mouth daily., Disp: , Rfl:    fluticasone (FLONASE) 50 MCG/ACT nasal spray, Place 2 sprays into both nostrils daily., Disp: 16 g, Rfl: 6   folic acid (FOLVITE) 1 MG tablet, Take 1 tablet by mouth daily., Disp: , Rfl: 0   gabapentin (NEURONTIN) 100 MG capsule, Take 100 mg by mouth daily., Disp: , Rfl:    hydroxychloroquine (PLAQUENIL) 200 MG tablet, Take 1.5 tablets by mouth daily., Disp: , Rfl: 0   lisinopril (ZESTRIL) 5 MG tablet, Take 1 tablet (5 mg total) by mouth daily., Disp: 90 tablet, Rfl: 1   PARoxetine (PAXIL) 20 MG tablet, Paxil 20 mg tablet   20  mg by oral route., Disp: , Rfl:    predniSONE (DELTASONE) 5 MG tablet, Take 5 mg by mouth daily., Disp: , Rfl: 1   sulfaSALAzine (AZULFIDINE) 500 MG tablet, Take by mouth at bedtime., Disp: , Rfl:    traZODone (DESYREL) 100 MG tablet, Take 2 tablets (200 mg total) by mouth at bedtime as needed for sleep., Disp: 180 tablet, Rfl: 1   triamcinolone cream (KENALOG) 0.1 %, APPLY AND RUB IN A THIN FILM TO AFFECTED AREAS TWICE DAILY.(AM AND PM)., Disp: , Rfl:   EXAM:  VITALS per patient if applicable:  Vitals with BMI 02/03/2021 05/31/2020 12/10/2019  Height _0  _1  5' 0.5"  Weight 151 lbs 148 lbs 145 lbs  BMI 29.49 13.2 44.01  Systolic 027 253 664  Diastolic 72 69 76  Pulse 98 115 105    GENERAL: alert, oriented, appears well and in no acute distress  HEENT: atraumatic, conjunttiva clear, no obvious abnormalities on inspection of external nose and ears  NECK: normal movements of the head and neck  LUNGS: on inspection no signs of respiratory distress, breathing rate appears normal, no obvious gross SOB, gasping or wheezing  CV: no obvious cyanosis  MS: moves all visible extremities without noticeable abnormality  PSYCH/NEURO: pleasant and cooperative, no obvious depression or anxiety, speech and thought processing grossly intact  LABS: none today    Chemistry      Component Value Date/Time   NA 138 02/03/2021 1215   NA 141 09/05/2017 0000   K 3.7 02/03/2021 1215   CL 103 02/03/2021 1215   CO2 24 02/03/2021 1215   BUN 16 02/03/2021 1215   BUN 9 09/05/2017 0000   CREATININE 0.71 02/03/2021 1215   GLU 77 09/05/2017 0000      Component Value Date/Time   CALCIUM 9.1 02/03/2021 1215   ALKPHOS 111 12/10/2019 1122   AST 18 12/10/2019 1122   ALT 16 12/10/2019 1122   BILITOT 0.3 12/10/2019 1122     Lab Results  Component Value Date   WBC 12.0 (H) 02/03/2021   HGB 13.3 02/03/2021   HCT 41.3 02/03/2021   MCV 98.4 02/03/2021   PLT 312.0 02/03/2021   Lab Results   Component Value Date   ESRSEDRATE 9 07/19/2016   Lab Results  Component Value Date   VITAMINB12 683 02/03/2021   Lab Results  Component Value Date   TSH 0.39 02/03/2021   Lab Results  Component Value Date   HGBA1C 6.2 03/24/2019   Lab Results  Component Value Date   TSH 0.39 02/03/2021   ASSESSMENT AND PLAN:  Discussed the following assessment and plan:  1) URI  with cough/congestion-->LRTI suspected. Monica Schwartz with lupus, on hydroxychloroquine, sulfasalazine, daily prednisone. She is at high risk for complication if she has covid.  I recommended she get a home test kit and if + call the MD on call to request oral antiviral. At this point I will rx levaquin 546m qd x 10d and prednisone 475mqd x 5d then 2027md x 5d.  Recommended mucinex dm q12h.   I discussed the assessment and treatment plan with the patient. The patient was provided an opportunity to ask questions and all were answered. The patient agreed with the plan and demonstrated an understanding of the instructions.   F/u: if not improving  Signed:  PhiCrissie SicklesD           02/17/2021

## 2021-02-20 NOTE — Telephone Encounter (Signed)
First need to get the MRI results before we can plan treatment for further evaluation.

## 2021-02-20 NOTE — Telephone Encounter (Signed)
Spoke with pt regarding MRI.

## 2021-03-01 ENCOUNTER — Ambulatory Visit
Admission: RE | Admit: 2021-03-01 | Discharge: 2021-03-01 | Disposition: A | Discharge status: Home / Self Care | Payer: Medicare Other | Source: Ambulatory Visit | Attending: Family Medicine | Admitting: Family Medicine

## 2021-03-01 ENCOUNTER — Other Ambulatory Visit: Payer: Self-pay

## 2021-03-01 DIAGNOSIS — M329 Systemic lupus erythematosus, unspecified: Secondary | ICD-10-CM

## 2021-03-01 DIAGNOSIS — H5509 Other forms of nystagmus: Secondary | ICD-10-CM

## 2021-03-01 DIAGNOSIS — R2681 Unsteadiness on feet: Secondary | ICD-10-CM

## 2021-03-01 DIAGNOSIS — R42 Dizziness and giddiness: Secondary | ICD-10-CM

## 2021-03-01 DIAGNOSIS — H9193 Unspecified hearing loss, bilateral: Secondary | ICD-10-CM

## 2021-03-01 DIAGNOSIS — R270 Ataxia, unspecified: Secondary | ICD-10-CM

## 2021-03-01 MED ORDER — GADOBUTROL 1 MMOL/ML IV SOLN
13.0000 mL | Freq: Once | INTRAVENOUS | Status: AC | PRN
  Administered 2021-03-01: 13 mL via INTRAVENOUS

## 2021-03-02 ENCOUNTER — Telehealth: Payer: Self-pay | Admitting: Family Medicine

## 2021-03-02 DIAGNOSIS — M329 Systemic lupus erythematosus, unspecified: Secondary | ICD-10-CM

## 2021-03-02 DIAGNOSIS — R42 Dizziness and giddiness: Secondary | ICD-10-CM

## 2021-03-02 DIAGNOSIS — R2681 Unsteadiness on feet: Secondary | ICD-10-CM

## 2021-03-02 DIAGNOSIS — R270 Ataxia, unspecified: Secondary | ICD-10-CM

## 2021-03-02 DIAGNOSIS — H5509 Other forms of nystagmus: Secondary | ICD-10-CM

## 2021-03-02 NOTE — Telephone Encounter (Signed)
Please inform patient the following information:  She was referred to ENT during her office visit on 02/03/2021.  Please ensure she has been able to get an appointment.  For her sinuses, ringing in her ears, gait disturbance and hearing loss.  The MRI results from her brain are  Overall RI is reassuring.  There are no masses, lesions or evidences of prior strokes etc. It did result with evidence of chronic small vessel ischemic disease and mild generalized atrophy.  The changes on MRI to her brain above are nonspecific.  However the small vessel ischemic disease can cause balance disturbances, memory changes and can be early signs that may progress to a vascular type of dementia.  Ensuring a healthy diet, routine exercise, keeping blood pressure and cholesterol in normal ranges will help. Since she is having some balance issues, I have referred her to neurology for evaluation and recommendations given changes on MRI.

## 2021-03-02 NOTE — Telephone Encounter (Signed)
Spoke with patient regarding results/recommendations,voiced understanding. She has not felt well enough to call ENT back to schedule an appointment but she will call soon.

## 2021-03-26 ENCOUNTER — Ambulatory Visit (INDEPENDENT_AMBULATORY_CARE_PROVIDER_SITE_OTHER): Payer: Medicare Other

## 2021-03-26 DIAGNOSIS — Z1231 Encounter for screening mammogram for malignant neoplasm of breast: Secondary | ICD-10-CM | POA: Diagnosis not present

## 2021-03-26 DIAGNOSIS — Z0000 Encounter for general adult medical examination without abnormal findings: Secondary | ICD-10-CM | POA: Diagnosis not present

## 2021-03-26 NOTE — Patient Instructions (Signed)
Fall Prevention in the Home, Adult Falls can cause injuries and can happen to people of all ages. There are many things you can do to make your home safe and to help prevent falls. Ask for help when making these changes. What actions can I take to prevent falls? General Instructions Use good lighting in all rooms. Replace any light bulbs that burn out. Turn on the lights in dark areas. Use night-lights. Keep items that you use often in easy-to-reach places. Lower the shelves around your home if needed. Set up your furniture so you have a clear path. Avoid moving your furniture around. Do not have throw rugs or other things on the floor that can make you trip. Avoid walking on wet floors. If any of your floors are uneven, fix them. Add color or contrast paint or tape to clearly mark and help you see: Grab bars or handrails. First and last steps of staircases. Where the edge of each step is. If you use a stepladder: Make sure that it is fully opened. Do not climb a closed stepladder. Make sure the sides of the stepladder are locked in place. Ask someone to hold the stepladder while you use it. Know where your pets are when moving through your home. What can I do in the bathroom?   Keep the floor dry. Clean up any water on the floor right away. Remove soap buildup in the tub or shower. Use nonskid mats or decals on the floor of the tub or shower. Attach bath mats securely with double-sided, nonslip rug tape. If you need to sit down in the shower, use a plastic, nonslip stool. Install grab bars by the toilet and in the tub and shower. Do not use towel bars as grab bars. What can I do in the bedroom? Make sure that you have a light by your bed that is easy to reach. Do not use any sheets or blankets for your bed that hang to the floor. Have a firm chair with side arms that you can use for support when you get dressed. What can I do in the kitchen? Clean up any spills right away. If you  need to reach something above you, use a step stool with a grab bar. Keep electrical cords out of the way. Do not use floor polish or wax that makes floors slippery. What can I do with my stairs? Do not leave any items on the stairs. Make sure that you have a light switch at the top and the bottom of the stairs. Make sure that there are handrails on both sides of the stairs. Fix handrails that are broken or loose. Install nonslip stair treads on all your stairs. Avoid having throw rugs at the top or bottom of the stairs. Choose a carpet that does not hide the edge of the steps on the stairs. Check carpeting to make sure that it is firmly attached to the stairs. Fix carpet that is loose or worn. What can I do on the outside of my home? Use bright outdoor lighting. Fix the edges of walkways and driveways and fix any cracks. Remove anything that might make you trip as you walk through a door, such as a raised step or threshold. Trim any bushes or trees on paths to your home. Check to see if handrails are loose or broken and that both sides of all steps have handrails. Install guardrails along the edges of any raised decks and porches. Clear paths of anything that can  make you trip, such as tools or rocks. Have leaves, snow, or ice cleared regularly. Use sand or salt on paths during winter. Clean up any spills in your garage right away. This includes grease or oil spills. What other actions can I take? Wear shoes that: Have a low heel. Do not wear high heels. Have rubber bottoms. Feel good on your feet and fit well. Are closed at the toe. Do not wear open-toe sandals. Use tools that help you move around if needed. These include: Canes. Walkers. Scooters. Crutches. Review your medicines with your doctor. Some medicines can make you feel dizzy. This can increase your chance of falling. Ask your doctor what else you can do to help prevent falls. Where to find more information Centers for  Disease Control and Prevention, [employer]: FootballExhibition.com.br [employer] on Aging: https://walker.com/ Contact a doctor if: You are afraid of falling at home. You feel weak, drowsy, or dizzy at home. You fall at home. Summary There are many simple things that you can do to make your home safe and to help prevent falls. Ways to make your home safe include removing things that can make you trip and installing grab bars in the bathroom. Ask for help when making these changes in your home. This information is not intended to replace advice given to you by your health care provider. Make sure you discuss any questions you have with your health care provider. Document Revised: 01/20/2020 Document Reviewed: 01/20/2020 Elsevier Patient Education  2022 Elsevier Inc.  Mammogram A mammogram is an X-ray of the breasts. This procedure can screen for and detect any changes that may indicate breast cancer. Mammograms are regularly done beginning at age 19 for women with average risk. A man may have a mammogram if he has a lump or swelling in his breast tissue. A mammogram can also identify other changes and variations in the breast, such as: Inflammation of the breast tissue (mastitis). An infected area that contains a collection of pus (abscess). A fluid-filled sac (cyst). Tumors that are not cancerous (benign). Fibrocystic changes. This is when breast tissue becomes denser and can make the tissue feel rope-like or uneven under the skin. Women at higher risk for breast cancer need earlier and more comprehensive screening for abnormal changes. Breast tomosynthesis, or three-dimensional (3D) mammography, and digital breast tomosynthesis are advanced forms of imaging that create 3D pictures of the breasts. Tell a health care provider: About any allergies you have. If you have breast implants. If you have had previous breast disease, biopsy, or surgery. If you have a family history of breast cancer. If you are  breastfeeding. Whether you are pregnant or may be pregnant. What are the risks? Generally, this is a safe procedure. However, problems may occur, including: Exposure to radiation. Radiation levels are very low with this test. The need for more tests. The mammogram fails to detect certain cancers or the results are misinterpreted. Difficulty with detecting breast cancer in women with dense breasts. What happens before the procedure? Schedule your test about 1-2 weeks after your menstrual period if you are menstruating. This is usually when your breasts are the least tender. If you have had a mammogram done at a different facility in the past, get the mammogram X-rays or have them sent to your current exam facility. The new and old images will be compared. Wash your breasts and underarms on the day of the test. Do not wear deodorants, perfumes, lotions, or powders anywhere on your body on  the day of the test. Remove any jewelry from your neck. Wear clothes that you can change into and out of easily. What happens during the procedure?  You will undress from the waist up and put on a gown that opens in the front. You will stand in front of the X-ray machine. Each breast will be placed between two plastic or glass plates. The plates will compress your breast for a few seconds. Try to stay as relaxed as possible. This procedure does not cause any harm to your breasts. Any discomfort you feel will be very brief. X-rays will be taken from different angles of each breast. The procedure may vary among health care providers and hospitals. What can I expect after the procedure? The mammogram will be examined by a specialist (radiologist). You may need to repeat certain parts of the test, depending on the quality of the images. This is done if the radiologist needs a better view of the breast tissue. You may resume your normal activities. It is up to you to get the results of your procedure. Ask your  health care provider, or the department that is doing the procedure, when your results will be ready. Summary A mammogram is an X-ray of the breasts. This procedure can screen for and detect any changes that may indicate breast cancer. A man may have a mammogram if he has a lump or swelling in his breast tissue. If you have had a mammogram done at a different facility in the past, get the mammogram X-rays or have them sent to your current exam facility in order to compare them. Schedule your test about 1-2 weeks after your menstrual period if you are menstruating. Ask when your test results will be ready. Make sure you get your test results. This information is not intended to replace advice given to you by your health care provider. Make sure you discuss any questions you have with your health care provider. Document Revised: 04/18/2020 Document Reviewed: 04/18/2020 Elsevier Patient Education  2022 [employer].

## 2021-03-26 NOTE — Progress Notes (Signed)
Subjective:   Monica Schwartz is a 70 y.o. female who presents for an Initial Medicare Annual Wellness Visit.  I connected with  Monica Schwartz on 03/26/21 by a audio enabled telemedicine application and verified that I am speaking with the correct person using two identifiers.   I discussed the limitations of evaluation and management by telemedicine. The patient expressed understanding and agreed to proceed. Location of patient: Home   Location of provider: Office       Persons participating in visit: Monica Schwartz (patient) and Monica Cones, LPN  Review of Systems    Defer to PCP Cardiac Risk Factors include: advanced age (>66men, >23 women)     Objective:    Today's Vitals   03/26/21 0827  PainSc: 0-No pain   There is no height or weight on file to calculate BMI.  No flowsheet data found.  Current Medications (verified) Outpatient Encounter Medications as of 03/26/2021  Medication Sig   albuterol (VENTOLIN HFA) 108 (90 Base) MCG/ACT inhaler Inhale 2 puffs into the lungs every 6 (six) hours as needed for wheezing or shortness of breath.   aspirin EC 81 MG tablet Take 81 mg by mouth.   Cholecalciferol 50 MCG (2000 UT) TABS 1 tab daily with food   CLARITIN-D 24 HOUR 10-240 MG 24 hr tablet Take 1 tablet by mouth daily.   Cyanocobalamin 1000 MCG SUBL 1000 mcg sublingual daily   DULoxetine (CYMBALTA) 60 MG capsule Take by mouth.   Ferrous Sulfate (IRON) 325 (65 Fe) MG TABS Take 1 tablet by mouth daily.   fluticasone (FLONASE) 50 MCG/ACT nasal spray Place 2 sprays into both nostrils daily.   folic acid (FOLVITE) 1 MG tablet Take 1 tablet by mouth daily.   gabapentin (NEURONTIN) 100 MG capsule Take 100 mg by mouth daily.   HYDROcodone-acetaminophen (NORCO) 10-325 MG tablet Take 1 tablet by mouth 4 (four) times daily as needed.   hydroxychloroquine (PLAQUENIL) 200 MG tablet Take 1.5 tablets by mouth daily.   levofloxacin (LEVAQUIN) 500 MG tablet Take 1 tablet (500 mg  total) by mouth daily.   lisinopril (ZESTRIL) 5 MG tablet Take 1 tablet (5 mg total) by mouth daily.   PARoxetine (PAXIL) 20 MG tablet Paxil 20 mg tablet   20 mg by oral route.   predniSONE (DELTASONE) 20 MG tablet 2 tabs po qd x 5d, then 1 tab po qd x 5d   predniSONE (DELTASONE) 5 MG tablet Take 5 mg by mouth daily.   sulfaSALAzine (AZULFIDINE) 500 MG tablet Take by mouth at bedtime.   traZODone (DESYREL) 100 MG tablet Take 2 tablets (200 mg total) by mouth at bedtime as needed for sleep.   triamcinolone cream (KENALOG) 0.1 % APPLY AND RUB IN A THIN FILM TO AFFECTED AREAS TWICE DAILY.(AM AND PM).   No facility-administered encounter medications on file as of 03/26/2021.    Allergies (verified) Penicillins, Fosamax [alendronate sodium], Quetiapine, and Seroquel [quetiapine fumarate]   History: Past Medical History:  Diagnosis Date   Allergy    Anemia    Anxiety    Asthma    Colon polyp    benign   Constipation    Diverticulosis    Dysphagia    Fatigue    Fatty liver    GERD (gastroesophageal reflux disease)    H/O seasonal allergies    Hiatal hernia    History of tremor 09/2013   Hyperlipidemia    Hypertension    Leucocytosis    Lymphocytosis  Major depressive disorder    with anxiety   Microscopic polyangiitis (HCC)    Migraine    Myalgia    Odynophagia    Osteoarthritis    Raynauds syndrome    Systemic lupus (HCC)    + ANA/DS-DNA, anti PR-3ab (neg C-ANCA), anti- cardiolipin IGM. Chronic leukocytosis and mild eosinophilia.    Past Surgical History:  Procedure Laterality Date   ABDOMINAL HYSTERECTOMY  1989   with removal of ovaries   APPENDECTOMY  1989   CESAREAN SECTION  1984   COLONOSCOPY  2014   LAPAROSCOPIC ABDOMINAL EXPLORATION     x2 for endometrosis 1980 and 1984   TONSILLECTOMY AND ADENOIDECTOMY  1970   Family History  Problem Relation Age of Onset   Stroke Mother    Arthritis Mother    Depression Mother    Alcohol abuse Father    COPD Father     Hearing loss Father    Arthritis Brother    Depression Brother    Parkinson's disease Brother    Depression Son    Breast cancer Paternal Aunt    Arthritis Paternal Aunt    Social History   Socioeconomic History   Marital status: Widowed    Spouse name: Not on file   Number of children: 1   Years of education: 14   Highest education level: Not on file  Occupational History   Occupation: retired  Tobacco Use   Smoking status: Former    Packs/day: 0.50    Years: 10.00    Pack years: 5.00    Types: Cigarettes   Smokeless tobacco: Never  Vaping Use   Vaping Use: Never used  Substance and Sexual Activity   Alcohol use: Yes    Comment: occasional   Drug use: No   Sexual activity: Never  Other Topics Concern   Not on file  Social History Narrative   Recently widowed March 2018. Has 2 children, one living named Monica Schwartz.   She is retired Sales promotion account executive.   She drinks caffeine. Takes a daily vitamin.   Wears her seatbelt, wears a bicycle helmet.   Routinely exercises.   Smoke detector in the home.   Feels safe in her relationships.   Social Determinants of Health   Financial Resource Strain: Low Risk    Difficulty of Paying Living Expenses: Not hard at all  Food Insecurity: Food Insecurity Present   Worried About Programme researcher, broadcasting/film/video in the Last Year: Sometimes true   Barista in the Last Year: Never true  Transportation Needs: No Transportation Needs   Lack of Transportation (Medical): No   Lack of Transportation (Non-Medical): No  Physical Activity: Not on file  Stress: No Stress Concern Present   Feeling of Stress : Not at all  Social Connections: Socially Isolated   Frequency of Communication with Friends and Family: More than three times a week   Frequency of Social Gatherings with Friends and Family: More than three times a week   Attends Religious Services: Never   Database administrator or Organizations: No   Attends Tax inspector Meetings: Never   Marital Status: Widowed    Tobacco Counseling Counseling given: Not Answered   Clinical Intake:     Pain Score: 0-No pain           Diabetic?NO         Activities of Daily Living In your present state of health, do you have any difficulty performing the  following activities: 03/26/2021  Hearing? Y  Comment Hearing exan and hearing test scheduled with Dr Ezzard Standing  Vision? Y  Comment Eye exam scheduled with Dr. Mardella Layman  Difficulty concentrating or making decisions? Y  Comment Appt. with Neurologist due to memory "glitches  Walking or climbing stairs? N  Comment becomes dizzy recent falls  Dressing or bathing? N  Doing errands, shopping? Y  Preparing Food and eating ? N  Using the Toilet? N  In the past six months, have you accidently leaked urine? Y  Comment sometimes when sneezing  Do you have problems with loss of bowel control? N  Managing your Medications? N  Managing your Finances? N  Housekeeping or managing your Housekeeping? N  Comment working on closing husbands estate  Some recent data might be hidden    Patient Care Team: Natalia Leatherwood, DO as PCP - General (Family Medicine) Francee Gentile, MD (Internal Medicine)  Indicate any recent Medical Services you may have received from other than [facility] providers in the past year (date may be approximate).     Assessment:   This is a routine wellness examination for [facility].  Hearing/Vision screen No results found.  Dietary issues and exercise activities discussed: Current Exercise Habits: Home exercise routine, Type of exercise: walking;stretching, Time (Minutes): 15, Frequency (Times/Week): 7, Weekly Exercise (Minutes/Week): 105, Intensity: Mild, Exercise limited by: orthopedic condition(s)   Goals Addressed   None    Depression Screen PHQ 2/9 Scores 03/26/2021 02/03/2021 05/31/2020 12/10/2019 03/24/2019 09/18/2018 03/20/2018  PHQ - 2 Score 1 0 4 2 4 2  0  PHQ- 9 Score 1  2 7 10 20 5  0    Fall Risk Fall Risk  03/26/2021 05/31/2020 03/24/2019 11/26/2017 09/17/2017  Falls in the past year? 1 0 0 Yes Yes  Number falls in past yr: 1 0 0 2 or more -  Injury with Fall? 1 0 0 Yes Yes  Risk Factor Category  - - - High Fall Risk -  Risk for fall due to : History of fall(s) - - History of fall(s);Other (Comment) -  Risk for fall due to: Comment - - - vertigo -  Follow up Education provided;Falls prevention discussed Falls evaluation completed Falls evaluation completed - -    FALL RISK PREVENTION PERTAINING TO THE HOME:  Any stairs in or around the home? Yes  If so, are there any without handrails? Yes  Home free of loose throw rugs in walkways, pet beds, electrical cords, etc? No  Adequate lighting in your home to reduce risk of falls? Yes   ASSISTIVE DEVICES UTILIZED TO PREVENT FALLS:  Life alert? No  Use of a cane, walker or w/c? Yes  Grab bars in the bathroom? Yes  Shower chair or bench in shower? Yes  Elevated toilet seat or a handicapped toilet? No   TIMED UP AND GO:  Was the test performed? No .  Length of time to ambulate 10 feet: n/a sec.     Cognitive Function:     6CIT Screen 03/26/2021  What Year? 0 points  What month? 0 points  What time? 0 points  Count back from 20 0 points  Months in reverse 0 points  Repeat phrase 6 points  Total Score 6    Immunizations Immunization History  Administered Date(s) Administered   Fluad Quad(high Dose 65+) 03/24/2019   Influenza Split 04/05/2016   Influenza, High Dose Seasonal PF 03/20/2018, 04/14/2020   Influenza,inj,Quad PF,6+ Mos 03/22/2017   PFIZER(Purple Top)SARS-COV-2 Vaccination 01/05/2020,  01/26/2020   Pneumococcal Conjugate-13 06/21/2015   Pneumococcal Polysaccharide-23 09/18/2018   Tdap 01/25/2014   Zoster Recombinat (Shingrix) 05/06/2019, 05/06/2019, 09/16/2019    TDAP status: Up to date  Flu Vaccine status: Up to date  Pneumococcal vaccine status: Up to date  Covid-19  vaccine status: Completed vaccines *Interested in [employer]  Qualifies for Shingles Vaccine? Yes   Zostavax completed  n/a   Shingrix Completed?: Yes  Screening Tests Health Maintenance  Topic Date Due   MAMMOGRAM  Never done   COVID-19 Vaccine (3 - Pfizer risk series) 02/23/2020   INFLUENZA VACCINE  04/05/2021 (Originally 01/30/2021)   COLONOSCOPY (Pts 45-80yrs Insurance coverage will need to be confirmed)  07/02/2022   TETANUS/TDAP  01/26/2024   DEXA SCAN  09/06/2027   Hepatitis C Screening  Completed   Zoster Vaccines- Shingrix  Completed   HPV VACCINES  Aged Out    Health Maintenance  Health Maintenance Due  Topic Date Due   MAMMOGRAM  Never done   COVID-19 Vaccine (3 - Pfizer risk series) 02/23/2020    Colorectal cancer screening: Type of screening: Colonoscopy. Completed yes. Repeat every 10 years  Mammogram status: Ordered will expire in Nov 2022. Pt provided with contact info and advised to call to schedule appt.  *Last ordered in 2019 but was not completed.  Bone Density status: Completed 2019. Results reflect: Bone density results: NORMAL. Repeat every 10 years.  Lung Cancer Screening: (Low Dose CT Chest recommended if Age 1-80 years, 30 pack-year currently smoking OR have quit w/in 15years.) does qualify.   Lung Cancer Screening Referral: n/a  Additional Screening:  Hepatitis C Screening: does not qualify; Completed yes  Vision Screening: Recommended annual ophthalmology exams for early detection of glaucoma and other disorders of the eye. Is the patient up to date with their annual eye exam?  Yes  Who is the provider or what is the name of the office in which the patient attends annual eye exams? Dr. Mardella Layman If pt is not established with a provider, would they like to be referred to a provider to establish care? Yes .  Prefers to wait at this time.  Dental Screening: Recommended annual dental exams for proper oral hygiene  Community Resource  Referral / Chronic Care Management: CRR required this visit?  No   CCM required this visit?  No      Plan:     I have personally reviewed and noted the following in the patient's chart:   Medical and social history Use of alcohol, tobacco or illicit drugs  Current medications and supplements including opioid prescriptions. Patient is currently taking opioid prescriptions. Information provided to patient regarding non-opioid alternatives. Patient advised to discuss non-opioid treatment plan with their provider. Functional ability and status Nutritional status Physical activity Advanced directives List of other physicians Hospitalizations, surgeries, and ER visits in previous 12 months Vitals Screenings to include cognitive, depression, and falls Referrals and appointments  In addition, I have reviewed and discussed with patient certain preventive protocols, quality metrics, and best practice recommendations. A written personalized care plan for preventive services as well as general preventive health recommendations were provided to patient.     Monica Cones, LPN   10/26/621   Nurse Notes: Non-Face to Face 45 minute visit Encounter. Non-Face to Face 45 minute visit Encounter.  .  Ms. Kantor , Thank you for taking time to come for your Medicare Wellness Visit. I appreciate your ongoing commitment to your health goals. Please review the following  plan we discussed and let me know if I can assist you in the future.   These are the goals we discussed:  Goals   None     This is a list of the screening recommended for you and due dates:  Health Maintenance  Topic Date Due   Mammogram  Never done   COVID-19 Vaccine (3 - Pfizer risk series) 02/23/2020   Flu Shot  04/05/2021*   Colon Cancer Screening  07/02/2022   Tetanus Vaccine  01/26/2024   DEXA scan (bone density measurement)  09/06/2027   Hepatitis C Screening: USPSTF Recommendation to screen - Ages 18-79 yo.   Completed   Zoster (Shingles) Vaccine  Completed   HPV Vaccine  Aged Out  *Topic was postponed. The date shown is not the original due date.

## 2021-04-04 ENCOUNTER — Other Ambulatory Visit: Payer: Self-pay

## 2021-04-04 ENCOUNTER — Ambulatory Visit (INDEPENDENT_AMBULATORY_CARE_PROVIDER_SITE_OTHER): Payer: Medicare Other | Admitting: Otolaryngology

## 2021-04-04 DIAGNOSIS — H903 Sensorineural hearing loss, bilateral: Secondary | ICD-10-CM

## 2021-04-04 DIAGNOSIS — H6981 Other specified disorders of Eustachian tube, right ear: Secondary | ICD-10-CM

## 2021-04-04 MED ORDER — FLUTICASONE PROPIONATE 50 MCG/ACT NA SUSP: 2.0000 | Freq: Every day | NASAL | 6 refills | Status: AC

## 2021-04-04 NOTE — Progress Notes (Signed)
HPI: Monica Schwartz is a 70 y.o. female who presents is referred by her PCP for evaluation of hearing loss that is worse in the right ear.  She has not previously had a hearing test..  Past Medical History:  Diagnosis Date   Allergy    Anemia    Anxiety    Asthma    Colon polyp    benign   Constipation    Diverticulosis    Dysphagia    Fatigue    Fatty liver    GERD (gastroesophageal reflux disease)    H/O seasonal allergies    Hiatal hernia    History of tremor 09/2013   Hyperlipidemia    Hypertension    Leucocytosis    Lymphocytosis    Major depressive disorder    with anxiety   Microscopic polyangiitis (HCC)    Migraine    Myalgia    Odynophagia    Osteoarthritis    Raynauds syndrome    Systemic lupus (HCC)    + ANA/DS-DNA, anti PR-3ab (neg C-ANCA), anti- cardiolipin IGM. Chronic leukocytosis and mild eosinophilia.    Past Surgical History:  Procedure Laterality Date   ABDOMINAL HYSTERECTOMY  1989   with removal of ovaries   APPENDECTOMY  1989   CESAREAN SECTION  1984   COLONOSCOPY  2014   LAPAROSCOPIC ABDOMINAL EXPLORATION     x2 for endometrosis 1980 and 1984   TONSILLECTOMY AND ADENOIDECTOMY  1970   Social History   Socioeconomic History   Marital status: Widowed    Spouse name: Not on file   Number of children: 1   Years of education: 14   Highest education level: Not on file  Occupational History   Occupation: retired  Tobacco Use   Smoking status: Former    Packs/day: 0.50    Years: 10.00    Pack years: 5.00    Types: Cigarettes   Smokeless tobacco: Never  Vaping Use   Vaping Use: Never used  Substance and Sexual Activity   Alcohol use: Yes    Comment: occasional   Drug use: No   Sexual activity: Never  Other Topics Concern   Not on file  Social History Narrative   Recently widowed March 2018. Has 2 children, one living named [place].   She is retired Sales promotion account executive.   She drinks caffeine. Takes a daily vitamin.    Wears her seatbelt, wears a bicycle helmet.   Routinely exercises.   Smoke detector in the home.   Feels safe in her relationships.   Social Determinants of Health   Financial Resource Strain: Low Risk    Difficulty of Paying Living Expenses: Not hard at all  Food Insecurity: Food Insecurity Present   Worried About Programme researcher, broadcasting/film/video in the Last Year: Sometimes true   Barista in the Last Year: Never true  Transportation Needs: No Transportation Needs   Lack of Transportation (Medical): No   Lack of Transportation (Non-Medical): No  Physical Activity: Not on file  Stress: No Stress Concern Present   Feeling of Stress : Not at all  Social Connections: Socially Isolated   Frequency of Communication with Friends and Family: More than three times a week   Frequency of Social Gatherings with Friends and Family: More than three times a week   Attends Religious Services: Never   Database administrator or Organizations: No   Attends Banker Meetings: Never   Marital Status: Widowed   Family  History  Problem Relation Age of Onset   Stroke Mother    Arthritis Mother    Depression Mother    Alcohol abuse Father    COPD Father    Hearing loss Father    Arthritis Brother    Depression Brother    Parkinson's disease Brother    Depression Son    Breast cancer Paternal Aunt    Arthritis Paternal Aunt    Allergies  Allergen Reactions   Penicillins Shortness Of Breath, Rash and Swelling   Fosamax [Alendronate Sodium]    Quetiapine Other (See Comments)    Tremor   Seroquel [Quetiapine Fumarate] Other (See Comments)    Tremor   Prior to Admission medications   Medication Sig Start Date End Date Taking? Authorizing Provider  albuterol (VENTOLIN HFA) 108 (90 Base) MCG/ACT inhaler Inhale 2 puffs into the lungs every 6 (six) hours as needed for wheezing or shortness of breath. 05/31/20   Kuneff, Renee A, DO  aspirin EC 81 MG tablet Take 81 mg by mouth.    [provider]  Cholecalciferol 50 MCG (2000 UT) TABS 1 tab daily with food 06/01/20   Kuneff, Renee A, DO  CLARITIN-D 24 HOUR 10-240 MG 24 hr tablet Take 1 tablet by mouth daily. 07/01/16   [provider]  Cyanocobalamin 1000 MCG SUBL 1000 mcg sublingual daily 06/01/20   Kuneff, Renee A, DO  DULoxetine (CYMBALTA) 60 MG capsule Take by mouth. 05/18/20   [provider]  Ferrous Sulfate (IRON) 325 (65 Fe) MG TABS Take 1 tablet by mouth daily.    [provider]  fluticasone (FLONASE) 50 MCG/ACT nasal spray Place 2 sprays into both nostrils daily. 02/03/21   Kuneff, Renee A, DO  folic acid (FOLVITE) 1 MG tablet Take 1 tablet by mouth daily. 06/28/16   [provider]  gabapentin (NEURONTIN) 100 MG capsule Take 100 mg by mouth daily. 05/17/20   [provider]  HYDROcodone-acetaminophen (NORCO) 10-325 MG tablet Take 1 tablet by mouth 4 (four) times daily as needed. 03/09/21   [provider]  hydroxychloroquine (PLAQUENIL) 200 MG tablet Take 1.5 tablets by mouth daily. 07/19/16   [provider]  levofloxacin (LEVAQUIN) 500 MG tablet Take 1 tablet (500 mg total) by mouth daily. 02/17/21   McGowen, Maryjean Morn, MD  lisinopril (ZESTRIL) 5 MG tablet Take 1 tablet (5 mg total) by mouth daily. 05/31/20   Kuneff, Renee A, DO  PARoxetine (PAXIL) 20 MG tablet Paxil 20 mg tablet   20 mg by oral route. 12/10/19   [provider]  predniSONE (DELTASONE) 20 MG tablet 2 tabs po qd x 5d, then 1 tab po qd x 5d 02/17/21   McGowen, Maryjean Morn, MD  predniSONE (DELTASONE) 5 MG tablet Take 5 mg by mouth daily. 08/25/16   [provider]  sulfaSALAzine (AZULFIDINE) 500 MG tablet Take by mouth at bedtime. 04/14/20   [provider]  traZODone (DESYREL) 100 MG tablet Take 2 tablets (200 mg total) by mouth at bedtime as needed for sleep. 05/31/20   Kuneff, Renee A, DO  triamcinolone cream (KENALOG) 0.1 % APPLY AND RUB IN A THIN FILM TO AFFECTED AREAS  TWICE DAILY.(AM AND PM). 07/24/18   [provider]     Positive ROS: Otherwise negative  All other systems have been reviewed and were otherwise negative with the exception of those mentioned in the HPI and as above.  Physical Exam: Constitutional: Alert, well-appearing, no acute distress Ears: External  ears without lesions or tenderness. Ear canals are clear bilaterally with intact, clear TMs.  Both middle ears spaces were clear with no obvious middle ear effusion noted. Nasal: External nose without lesions. Septum with minimal deformity and mild rhinitis.  Both middle meatus regions were clear with no signs of infection.  No polyps noted..  Oral: Lips and gums without lesions. Tongue and palate mucosa without lesions. Posterior oropharynx clear. Neck: No palpable adenopathy or masses Respiratory: Breathing comfortably  Skin: No facial/neck lesions or rash noted.  Audiologic testing in the office today demonstrated a mild to moderate sensorineural hearing loss in both ears slightly worse on the right side.  She also had a type C tympanogram on the right side and a type A tympanogram on the left side.  SRT's were 40 DB on the right and 25 DB on the left.  Procedures  Assessment: Bilateral sensorineural hearing loss slightly worse on the right side. Mild right eustachian tube dysfunction  Plan: Briefly discussed with her concerning benefits of hearing aids and she would be a candidate for hearing aids and will discuss this with our audiologist. Prescribed Flonase 2 sprays each nostril at night as this will help some with congestion in the right ear.   Narda Bonds, MD   CC:

## 2021-04-05 ENCOUNTER — Encounter (INDEPENDENT_AMBULATORY_CARE_PROVIDER_SITE_OTHER): Payer: Self-pay

## 2021-04-13 ENCOUNTER — Other Ambulatory Visit: Payer: Self-pay | Admitting: Family Medicine

## 2021-04-13 DIAGNOSIS — F419 Anxiety disorder, unspecified: Secondary | ICD-10-CM

## 2021-04-13 DIAGNOSIS — F332 Major depressive disorder, recurrent severe without psychotic features: Secondary | ICD-10-CM

## 2021-05-01 ENCOUNTER — Encounter: Payer: Self-pay | Admitting: Neurology

## 2021-05-01 ENCOUNTER — Ambulatory Visit: Payer: Medicare Other | Admitting: Neurology

## 2021-05-01 VITALS — BP 120/77 | HR 86 | Ht 60.0 in | Wt 154.0 lb

## 2021-05-01 DIAGNOSIS — R42 Dizziness and giddiness: Secondary | ICD-10-CM

## 2021-05-01 MED ORDER — DIPHENHYDRAMINE HCL 25 MG PO CAPS: 25.0000 mg | ORAL_CAPSULE | Freq: Three times a day (TID) | ORAL | 0 refills | Status: DC | PRN

## 2021-05-01 NOTE — Patient Instructions (Signed)
Trial of Benadryl 25 mg as needed up to 3 times daily  Vestibular Rehab  Follow up with your PMD  Return in 6 months for follow up.

## 2021-05-01 NOTE — Progress Notes (Signed)
[facility]  PATIENT: Monica Schwartz DOB: 07/26/1950  REFERRING CLINICIAN: Claiborne Billings, Renee A, DO HISTORY FROM: Patient REASON FOR VISIT: Recurrent Dizziness    HISTORICAL  CHIEF COMPLAINT:  Chief Complaint  Patient presents with   New Patient (Initial Visit)    Rm 13, referral for Balance difficulty, ataxia, MRI with chronic small vessel disease and mild atrophy Pt would like to discuss mri, and balance issues, reports 4 falls this year     HISTORY OF PRESENT ILLNESS:  This is a 70 year old woman with past medical history of SLE, depression, hypertension and asthma who is presenting with dizziness.  Patient reports a history of dizziness since Nov 10, 2016 after the death of her husband, she described dizziness as being unsteady and being "a little off".  She reported the episodes of dizziness are getting worse and currently she is getting 1-2 episodes per week.  Dizziness is described as being unsteady, worse with sudden movement of the head, worse with changing position from sitting to standing and she is have to wait for it to pass, sometimes it last 30 seconds of time it can go up to 20 minutes.  She has not tried any medication for it, nor therapy.  She denies hearing loss, denies any tinnitus.  She was seen by ENT and was cleared.  She also had a recent MRI brain which showed mild atrophy and chronic small vessel disease.  Because of the dizziness patient report numerous fall, no fracture.  She uses a walker when ambulating outside of her house. Patient reported being diagnosed with depression since the death of her husband in 11-10-16, she is currently seeing a psychiatrist.   OTHER MEDICAL CONDITIONS: SLE, Depression, HTN, Asthma    REVIEW OF SYSTEMS: Full 14 system review of systems performed and negative with exception of: as noted in the HPI  ALLERGIES: Allergies  Allergen Reactions   Penicillins Shortness Of Breath, Rash and Swelling   Fosamax [Alendronate  Sodium]    Quetiapine Other (See Comments)    Tremor   Seroquel [Quetiapine Fumarate] Other (See Comments)    Tremor    HOME MEDICATIONS: Outpatient Medications Prior to Visit  Medication Sig Dispense Refill   albuterol (VENTOLIN HFA) 108 (90 Base) MCG/ACT inhaler Inhale 2 puffs into the lungs every 6 (six) hours as needed for wheezing or shortness of breath. 6.7 g 11   aspirin EC 81 MG tablet Take 81 mg by mouth.     Cholecalciferol 50 MCG (2000 UT) TABS 1 tab daily with food 90 tablet 3   CLARITIN-D 24 HOUR 10-240 MG 24 hr tablet Take 1 tablet by mouth daily.  1   Cyanocobalamin 1000 MCG SUBL 1000 mcg sublingual daily 90 tablet 3   DULoxetine (CYMBALTA) 60 MG capsule Take by mouth.     Ferrous Sulfate (IRON) 325 (65 Fe) MG TABS Take 1 tablet by mouth daily.     fluticasone (FLONASE) 50 MCG/ACT nasal spray Place 2 sprays into both nostrils daily. 2 sprays each nostril at night 16 g 6   folic acid (FOLVITE) 1 MG tablet Take 1 tablet by mouth daily.  0   gabapentin (NEURONTIN) 100 MG capsule Take 100 mg by mouth daily.     HYDROcodone-acetaminophen (NORCO) 10-325 MG tablet Take 1 tablet by mouth 4 (four) times daily as needed.     hydroxychloroquine (PLAQUENIL) 200 MG tablet Take 1.5 tablets by mouth daily.  0   lisinopril (ZESTRIL) 5 MG tablet Take 1 tablet (5  mg total) by mouth daily. 90 tablet 1   PARoxetine (PAXIL) 20 MG tablet Paxil 20 mg tablet   20 mg by oral route.     predniSONE (DELTASONE) 5 MG tablet Take 5 mg by mouth daily.  1   sulfaSALAzine (AZULFIDINE) 500 MG tablet Take by mouth at bedtime.     traZODone (DESYREL) 100 MG tablet TAKE TWO TABLETS BY MOUTH AT BEDTIME AS NEEDED FOR SLEEP 60 tablet 0   triamcinolone cream (KENALOG) 0.1 % APPLY AND RUB IN A THIN FILM TO AFFECTED AREAS TWICE DAILY.(AM AND PM).     fluticasone (FLONASE) 50 MCG/ACT nasal spray Place 2 sprays into both nostrils daily. 16 g 6   levofloxacin (LEVAQUIN) 500 MG tablet Take 1 tablet (500 mg total) by  mouth daily. 10 tablet 0   predniSONE (DELTASONE) 20 MG tablet 2 tabs po qd x 5d, then 1 tab po qd x 5d 15 tablet 0   No facility-administered medications prior to visit.    PAST MEDICAL HISTORY: Past Medical History:  Diagnosis Date   Allergy    Anemia    Anxiety    Asthma    Colon polyp    benign   Constipation    Diverticulosis    Dysphagia    Fatigue    Fatty liver    GERD (gastroesophageal reflux disease)    H/O seasonal allergies    Hiatal hernia    History of tremor 09/2013   Hyperlipidemia    Hypertension    Leucocytosis    Lymphocytosis    Major depressive disorder    with anxiety   Microscopic polyangiitis (HCC)    Migraine    Myalgia    Odynophagia    Osteoarthritis    Raynauds syndrome    Systemic lupus (HCC)    + ANA/DS-DNA, anti PR-3ab (neg C-ANCA), anti- cardiolipin IGM. Chronic leukocytosis and mild eosinophilia.     PAST SURGICAL HISTORY: Past Surgical History:  Procedure Laterality Date   ABDOMINAL HYSTERECTOMY  1989   with removal of ovaries   APPENDECTOMY  1989   CESAREAN SECTION  1984   COLONOSCOPY  2014   LAPAROSCOPIC ABDOMINAL EXPLORATION     x2 for endometrosis 1980 and 1984   TONSILLECTOMY AND ADENOIDECTOMY  1970    FAMILY HISTORY: Family History  Problem Relation Age of Onset   Stroke Mother    Arthritis Mother    Depression Mother    Alcohol abuse Father    COPD Father    Hearing loss Father    Arthritis Brother    Depression Brother    Parkinson's disease Brother    Depression Son    Breast cancer Paternal Aunt    Arthritis Paternal Aunt     SOCIAL HISTORY: Social History   Socioeconomic History   Marital status: Widowed    Spouse name: Not on file   Number of children: 1   Years of education: 14   Highest education level: Not on file  Occupational History   Occupation: retired  Tobacco Use   Smoking status: Former    Packs/day: 0.50    Years: 10.00    Pack years: 5.00    Types: Cigarettes   Smokeless  tobacco: Never  Vaping Use   Vaping Use: Never used  Substance and Sexual Activity   Alcohol use: Yes    Comment: occasional   Drug use: No   Sexual activity: Never  Other Topics Concern   Not on file  Social  History Narrative   Recently widowed March 2018. Has 2 children, one living named [place].   She is retired Sales promotion account executive.   She drinks caffeine. Takes a daily vitamin.   Wears her seatbelt, wears a bicycle helmet.   Routinely exercises.   Smoke detector in the home.   Feels safe in her relationships.   Social Determinants of Health   Financial Resource Strain: Low Risk    Difficulty of Paying Living Expenses: Not hard at all  Food Insecurity: Food Insecurity Present   Worried About Programme researcher, broadcasting/film/video in the Last Year: Sometimes true   Barista in the Last Year: Never true  Transportation Needs: No Transportation Needs   Lack of Transportation (Medical): No   Lack of Transportation (Non-Medical): No  Physical Activity: Not on file  Stress: No Stress Concern Present   Feeling of Stress : Not at all  Social Connections: Socially Isolated   Frequency of Communication with Friends and Family: More than three times a week   Frequency of Social Gatherings with Friends and Family: More than three times a week   Attends Religious Services: Never   Database administrator or Organizations: No   Attends Banker Meetings: Never   Marital Status: Widowed  Intimate Partner Violence: Not on file     PHYSICAL EXAM  GENERAL EXAM/CONSTITUTIONAL: Vitals:  Vitals:   05/01/21 1323  BP: 120/77  Pulse: 86  Weight: 154 lb (69.9 kg)  Height: 5' (1.524 m)   Body mass index is 30.08 kg/m. Wt Readings from Last 3 Encounters:  05/01/21 154 lb (69.9 kg)  02/03/21 151 lb (68.5 kg)  05/31/20 148 lb (67.1 kg)   Patient is in no distress; well developed, nourished and groomed; neck is supple  EYES: Pupils round and reactive to light, Visual  fields full to confrontation, Extraocular movements intacts,   MUSCULOSKELETAL: Gait, strength, tone, movements noted in Neurologic exam below  NEUROLOGIC: MENTAL STATUS:  No flowsheet data found. awake, alert, oriented to person, place and time recent and remote memory intact normal attention and concentration language fluent, comprehension intact, naming intact fund of knowledge appropriate  CRANIAL NERVE:  2nd, 3rd, 4th, 6th - pupils equal and reactive to light, visual fields full to confrontation, extraocular muscles intact, no nystagmus 5th - facial sensation symmetric 7th - facial strength symmetric 8th - hearing intact 9th - palate elevates symmetrically, uvula midline 11th - shoulder shrug symmetric 12th - tongue protrusion midline  MOTOR:  normal bulk and tone, full strength in the BUE, BLE  SENSORY:  normal and symmetric to light touch, pinprick, temperature, vibration  COORDINATION:  finger-nose-finger, fine finger movements normal  REFLEXES:  deep tendon reflexes present and symmetric  GAIT/STATION:  normal   DIAGNOSTIC DATA (LABS, IMAGING, TESTING) - I reviewed patient records, labs, notes, testing and imaging myself where available.  Lab Results  Component Value Date   WBC 12.0 (H) 02/03/2021   HGB 13.3 02/03/2021   HCT 41.3 02/03/2021   MCV 98.4 02/03/2021   PLT 312.0 02/03/2021      Component Value Date/Time   NA 138 02/03/2021 1215   NA 141 09/05/2017 0000   K 3.7 02/03/2021 1215   CL 103 02/03/2021 1215   CO2 24 02/03/2021 1215   GLUCOSE 111 (H) 02/03/2021 1215   BUN 16 02/03/2021 1215   BUN 9 09/05/2017 0000   CREATININE 0.71 02/03/2021 1215   CALCIUM 9.1 02/03/2021 1215   PROT  6.5 12/10/2019 1122   ALBUMIN 4.2 12/10/2019 1122   AST 18 12/10/2019 1122   ALT 16 12/10/2019 1122   ALKPHOS 111 12/10/2019 1122   BILITOT 0.3 12/10/2019 1122   Lab Results  Component Value Date   CHOL 211 (H) 03/24/2019   HDL 57.40 03/24/2019    LDLCALC 131 (H) 03/24/2019   TRIG 116.0 03/24/2019   CHOLHDL 4 03/24/2019   Lab Results  Component Value Date   HGBA1C 6.2 03/24/2019   Lab Results  Component Value Date   VITAMINB12 683 02/03/2021   Lab Results  Component Value Date   TSH 0.39 02/03/2021    Brain MRI 03/01/2021 No evidence of acute intracranial abnormality. No cerebellopontine angle or internal auditory canal mass. Mild multifocal T2/FLAIR hyperintense signal abnormality within the cerebral white matter, nonspecific but likely reflecting chronics mall vessel ischemic disease. Mild generalized cerebral atrophy     ASSESSMENT AND PLAN  70 y.o. year old female with past medical history of depression, SLE, hypertension and asthma who is presenting with 6-year history of intermittent dizziness.  Patient reports dizziness started after the death of her husband in 11/12/16 and getting worse. Currently, she is having 2 episodes per week, described as being unsteady, sometimes associated with falls, no fractures.  She has not tried any medication previously and has never been on therapy.  The etiology of her dizziness is unclear, her MRI was negative for any acute or previous stroke and she was cleared by ENT.  Because she has an allergy to quetiapine (interaction with meclizine), I will try her on Benadryl 25 mg every 8 as needed.  I will also refer her to vestibular therapy.  I will see the patient in 31-month for follow-up.   1. Dizziness      PLAN: Trial of Benadryl 25 mg as needed up to 3 times daily  Vestibular Rehab  Follow up with your PMD  Return in 6 months for follow up.   Orders Placed This Encounter  Procedures   PT vestibular rehab    Meds ordered this encounter  Medications   diphenhydrAMINE (BENADRYL) 25 mg capsule    Sig: Take 1 capsule (25 mg total) by mouth every 8 (eight) hours as needed.    Dispense:  30 capsule    Refill:  0    Return in about 6 months (around 10/29/2021).    Windell Norfolk, MD 05/01/2021, 8:50 PM  [facility] [address] [place], [place] [address] [phone]

## 2021-05-09 DIAGNOSIS — M329 Systemic lupus erythematosus, unspecified: Secondary | ICD-10-CM | POA: Diagnosis not present

## 2021-05-09 DIAGNOSIS — M2550 Pain in unspecified joint: Secondary | ICD-10-CM | POA: Diagnosis not present

## 2021-05-09 DIAGNOSIS — Z79899 Other long term (current) drug therapy: Secondary | ICD-10-CM | POA: Diagnosis not present

## 2021-05-18 DIAGNOSIS — M5416 Radiculopathy, lumbar region: Secondary | ICD-10-CM | POA: Diagnosis not present

## 2021-08-24 DIAGNOSIS — M5416 Radiculopathy, lumbar region: Secondary | ICD-10-CM | POA: Diagnosis not present

## 2021-08-25 DIAGNOSIS — D721 Eosinophilia, unspecified: Secondary | ICD-10-CM | POA: Diagnosis not present

## 2021-08-25 DIAGNOSIS — D708 Other neutropenia: Secondary | ICD-10-CM | POA: Diagnosis not present

## 2021-08-25 DIAGNOSIS — D72828 Other elevated white blood cell count: Secondary | ICD-10-CM | POA: Diagnosis not present

## 2021-08-31 DIAGNOSIS — M79645 Pain in left finger(s): Secondary | ICD-10-CM | POA: Diagnosis not present

## 2021-08-31 DIAGNOSIS — M13842 Other specified arthritis, left hand: Secondary | ICD-10-CM | POA: Diagnosis not present

## 2021-08-31 DIAGNOSIS — M13841 Other specified arthritis, right hand: Secondary | ICD-10-CM | POA: Diagnosis not present

## 2021-08-31 DIAGNOSIS — M79644 Pain in right finger(s): Secondary | ICD-10-CM | POA: Diagnosis not present

## 2021-09-28 DIAGNOSIS — M329 Systemic lupus erythematosus, unspecified: Secondary | ICD-10-CM | POA: Diagnosis not present

## 2021-09-28 DIAGNOSIS — Z79899 Other long term (current) drug therapy: Secondary | ICD-10-CM | POA: Diagnosis not present

## 2021-10-18 ENCOUNTER — Ambulatory Visit (INDEPENDENT_AMBULATORY_CARE_PROVIDER_SITE_OTHER): Payer: Medicare Other | Admitting: Family Medicine

## 2021-10-18 ENCOUNTER — Encounter: Payer: Self-pay | Admitting: Family Medicine

## 2021-10-18 VITALS — BP 115/78 | HR 135 | Temp 98.7°F | Ht 60.0 in | Wt 145.0 lb

## 2021-10-18 DIAGNOSIS — F419 Anxiety disorder, unspecified: Secondary | ICD-10-CM | POA: Diagnosis not present

## 2021-10-18 DIAGNOSIS — E782 Mixed hyperlipidemia: Secondary | ICD-10-CM

## 2021-10-18 DIAGNOSIS — F332 Major depressive disorder, recurrent severe without psychotic features: Secondary | ICD-10-CM | POA: Diagnosis not present

## 2021-10-18 DIAGNOSIS — R000 Tachycardia, unspecified: Secondary | ICD-10-CM | POA: Diagnosis not present

## 2021-10-18 DIAGNOSIS — I10 Essential (primary) hypertension: Secondary | ICD-10-CM

## 2021-10-18 DIAGNOSIS — M329 Systemic lupus erythematosus, unspecified: Secondary | ICD-10-CM | POA: Diagnosis not present

## 2021-10-18 MED ORDER — ALBUTEROL SULFATE HFA 108 (90 BASE) MCG/ACT IN AERS: 2.0000 | INHALATION_SPRAY | Freq: Four times a day (QID) | RESPIRATORY_TRACT | 11 refills | Status: DC | PRN

## 2021-10-18 MED ORDER — PROPRANOLOL HCL ER 60 MG PO CP24: 60.0000 mg | ORAL_CAPSULE | Freq: Every day | ORAL | 1 refills | Status: DC

## 2021-10-18 NOTE — Progress Notes (Signed)
? ? ? ?This visit occurred during the SARS-CoV-2 public health emergency.  Safety protocols were in place, including screening questions prior to the visit, additional usage of staff PPE, and extensive cleaning of exam room while observing appropriate contact time as indicated for disinfecting solutions.  ? ? ?Monica DraftsCathy C Frey , [birth year], 71 y.o., female ?MRN: [id number] ?Patient Care Team  ?  Relationship Specialty Notifications Start End  ?Natalia LeatherwoodKuneff, Syrai Gladwin A, DO PCP - General Family Medicine  10/02/16   ?Francee GentileZiolkowska, Aldona, MD  Internal Medicine  10/02/16   ? Comment: rheumatologist- SLE  ? ? ?Chief Complaint  ?Patient presents with  ? Hypertension  ?  [facility]; pt is not fasting  ? ?  ?Subjective: Monica DraftsCathy C Krog is a 71 y.o. female [facility] ?Essential hypertension/HLD:  ?Pt reports  NONcompliance with lisinopril 5 mg QD.  She reports she ran out of medicine and she has not been taking med for a couple months.  Her blood pressures are normal today. Patient denies chest pain, shortness of breath, dizziness or lower extremity edema.  ?Pt takes a daily baby ASA. Pt is not prescribed statin.  ?She is on chronic steroids for her rheumatological disorder. ?Diet: low sodium ?Exercise: routinely ?RF: HTN, HLD, Lupus, FHX stroke ?  ?Depression/anxiety:  ?Pt reports she is doing okay for her depression anxiety standpoint.  She is now being managed by her psychiatry team and is prescribed trazodone and Lexapro. ?  ? ? ?  10/18/2021  ?  1:42 PM 03/26/2021  ?  8:46 AM 02/03/2021  ?  1:10 PM 05/31/2020  ? 10:09 AM 12/10/2019  ? 10:12 AM  ?Depression screen PHQ 2/9  ?Decreased Interest 1 0 0 3 1  ?Down, Depressed, Hopeless 1 1 0 1 1  ?PHQ - 2 Score 2 1 0 4 2  ?Altered sleeping 1 0 0 0 2  ?Tired, decreased energy 1 0 1 0 3  ?Change in appetite 1 0 1 0 1  ?Feeling bad or failure about yourself  1 0 0 1 0  ?Trouble concentrating 1 0 0 2 0  ?Moving slowly or fidgety/restless 1 0 0 0 1  ?Suicidal thoughts 1 0 0 0 1  ?PHQ-9 Score 9 1 2 7 10    ?Difficult doing work/chores     Very difficult  ? ? ?Allergies  ?Allergen Reactions  ? Penicillins Shortness Of Breath, Rash and Swelling  ? Fosamax [Alendronate Sodium]   ? Quetiapine Other (See Comments)  ?  Tremor  ? Seroquel [Quetiapine Fumarate] Other (See Comments)  ?  Tremor  ? ?Social History  ? ?Social History Narrative  ? Recently widowed March 2018. Has 2 children, one living named Harrold Donathathan.  ? 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  ? She drinks caffeine. Takes a daily vitamin.  ? Wears her seatbelt, wears a bicycle helmet.  ? Routinely exercises.  ? Smoke detector in the home.  ? Feels safe in her relationships.  ? ?Past Medical History:  ?Diagnosis Date  ? Allergy   ? Anemia   ? Anxiety   ? Asthma   ? Colon polyp   ? benign  ? Constipation   ? Diverticulosis   ? Dysphagia   ? Fatigue   ? Fatty liver   ? Gait instability 02/03/2021  ? GERD (gastroesophageal reflux disease)   ? H/O seasonal allergies   ? Hiatal hernia   ? History of tremor 09/2013  ? Hyperlipidemia   ? Hypertension   ?  Leucocytosis   ? Lymphocytosis   ? Major depressive disorder   ? with anxiety  ? Microscopic polyangiitis (HCC)   ? Migraine   ? Migraine headache 09/27/2015  ? Myalgia   ? Odynophagia   ? Osteoarthritis   ? Raynauds syndrome   ? Systemic lupus (HCC)   ? + ANA/DS-DNA, anti PR-3ab (neg C-ANCA), anti- cardiolipin IGM. Chronic leukocytosis and mild eosinophilia.   ? ?Past Surgical History:  ?Procedure Laterality Date  ? ABDOMINAL HYSTERECTOMY  1989  ? with removal of ovaries  ? APPENDECTOMY  1989  ? CESAREAN SECTION  1984  ? COLONOSCOPY  2014  ? LAPAROSCOPIC ABDOMINAL EXPLORATION    ? x2 for endometrosis 1980 and 1984  ? TONSILLECTOMY AND ADENOIDECTOMY  1970  ? ?Family History  ?Problem Relation Age of Onset  ? Stroke Mother   ? Arthritis Mother   ? Depression Mother   ? Alcohol abuse Father   ? COPD Father   ? Hearing loss Father   ? Arthritis Brother   ? Depression Brother   ? Parkinson's disease Brother   ?  Depression Son   ? Breast cancer Paternal Aunt   ? Arthritis Paternal Aunt   ? ?Allergies as of 10/18/2021   ? ?   Reactions  ? Penicillins Shortness Of Breath, Rash, Swelling  ? Fosamax [alendronate Sodium]   ? Quetiapine Other (See Comments)  ? Tremor  ? Seroquel [quetiapine Fumarate] Other (See Comments)  ? Tremor  ? ?  ? ?  ?Medication List  ?  ? ?  ? Accurate as of October 18, 2021 11:59 PM. If you have any questions, ask your nurse or doctor.  ?  ?  ? ?  ? ?STOP taking these medications   ? ?diphenhydrAMINE 25 mg capsule ?Commonly known as: BENADRYL ?Stopped by: Felix Pacini, DO ?  ?DULoxetine 60 MG capsule ?Commonly known as: CYMBALTA ?Stopped by: Felix Pacini, DO ?  ?lisinopril 5 MG tablet ?Commonly known as: ZESTRIL ?Stopped by: Felix Pacini, DO ?  ?PARoxetine 20 MG tablet ?Commonly known as: PAXIL ?Stopped by: Felix Pacini, DO ?  ? ?  ? ?TAKE these medications   ? ?albuterol 108 (90 Base) MCG/ACT inhaler ?Commonly known as: VENTOLIN HFA ?Inhale 2 puffs into the lungs every 6 (six) hours as needed for wheezing or shortness of breath. ?  ?aspirin EC 81 MG tablet ?Take 81 mg by mouth. ?  ?Cholecalciferol 50 MCG (2000 UT) Tabs ?1 tab daily with food ?  ?Claritin-D 24 Hour 10-240 MG 24 hr tablet ?Generic drug: loratadine-pseudoephedrine ?Take 1 tablet by mouth daily. ?  ?Cyanocobalamin 1000 MCG Subl ?1000 mcg sublingual daily ?  ?fluticasone 50 MCG/ACT nasal spray ?Commonly known as: FLONASE ?Place 2 sprays into both nostrils daily. 2 sprays each nostril at night ?  ?folic acid 1 MG tablet ?Commonly known as: FOLVITE ?Take 1 tablet by mouth daily. ?  ?gabapentin 100 MG capsule ?Commonly known as: NEURONTIN ?Take 100 mg by mouth daily. ?  ?HYDROcodone-acetaminophen 10-325 MG tablet ?Commonly known as: NORCO ?Take 1 tablet by mouth 4 (four) times daily as needed. ?  ?hydroxychloroquine 200 MG tablet ?Commonly known as: PLAQUENIL ?Take 1.5 tablets by mouth daily. ?  ?Iron 325 (65 Fe) MG Tabs ?Take 1 tablet by mouth  daily. ?  ?predniSONE 5 MG tablet ?Commonly known as: DELTASONE ?Take 5 mg by mouth daily. ?  ?propranolol ER 60 MG 24 hr capsule ?Commonly known as: INDERAL LA ?Take 1 capsule (60  mg total) by mouth daily. ?Started by: Felix Pacini, DO ?  ?sulfaSALAzine 500 MG tablet ?Commonly known as: AZULFIDINE ?Take by mouth at bedtime. ?  ?traZODone 100 MG tablet ?Commonly known as: DESYREL ?TAKE TWO TABLETS BY MOUTH AT BEDTIME AS NEEDED FOR SLEEP ?  ?triamcinolone cream 0.1 % ?Commonly known as: KENALOG ?APPLY AND RUB IN A THIN FILM TO AFFECTED AREAS TWICE DAILY.(AM AND PM). ?  ? ?  ? ? ?All past medical history, surgical history, allergies, family history, immunizations andmedications were updated in the EMR today and reviewed under the history and medication portions of their EMR.    ? ?ROS: Negative, with the exception of above mentioned in HPI ? ? ?Objective:  ?BP 115/78   Pulse (!) 135   Temp 98.7 ?F (37.1 ?C) (Oral)   Ht 5' (1.524 m)   Wt 145 lb (65.8 kg)   SpO2 98%   BMI 28.32 kg/m?  ?Body mass index is 28.32 kg/m?Marland Kitchen ?Physical Exam ?Vitals and nursing note reviewed.  ?Constitutional:   ?   General: She is not in acute distress. ?   Appearance: Normal appearance. She is not ill-appearing, toxic-appearing or diaphoretic.  ?HENT:  ?   Head: Normocephalic and atraumatic.  ?   Mouth/Throat:  ?   Mouth: Mucous membranes are moist.  ?Eyes:  ?   General: No scleral icterus.    ?   Right eye: No discharge.     ?   Left eye: No discharge.  ?   Extraocular Movements: Extraocular movements intact.  ?   Conjunctiva/sclera: Conjunctivae normal.  ?   Pupils: Pupils are equal, round, and reactive to light.  ?Neck:  ?   Comments: No thyromegaly ?Cardiovascular:  ?   Rate and Rhythm: Regular rhythm. Tachycardia present.  ?   Heart sounds: No murmur heard. ?  No gallop.  ?Pulmonary:  ?   Effort: Pulmonary effort is normal. No respiratory distress.  ?   Breath sounds: Normal breath sounds. No wheezing, rhonchi or rales.   ?Musculoskeletal:  ?   Cervical back: Neck supple. No tenderness.  ?   Right lower leg: No edema.  ?   Left lower leg: No edema.  ?Lymphadenopathy:  ?   Cervical: No cervical adenopathy.  ?Skin: ?   General: Skin is warm and dry.

## 2021-10-18 NOTE — Patient Instructions (Signed)
Return in about 24 weeks (around 04/04/2022) for Routine chronic condition follow-up. ? ? ?Great to see you today.  ?I have refilled the medication(s) we provide.  ? ?If labs were collected, we will inform you of lab results once received either by echart message or telephone call.  ? - echart message- for normal results that have been seen by the patient already.  ? - telephone call: abnormal results or if patient has not viewed results in their echart. ? ? ?Sinus Tachycardia ? ?Sinus tachycardia is a kind of fast heartbeat. In sinus tachycardia, the heart beats more than 100 times a minute. Sinus tachycardia starts in a part of the heart called the sinus node. Sinus tachycardia may be harmless, or it may be a sign of a serious condition. ?What are the causes? ?This condition may be caused by: ?Exercise or exertion. ?A fever. ?Pain. ?Loss of body fluids (dehydration). ?Severe bleeding (hemorrhage). ?Anxiety and stress. ?Certain substances, including: ?Alcohol. ?Caffeine. ?Tobacco and nicotine products. ?Cold medicines. ?Illegal drugs. ?Medical conditions including: ?Heart disease. ?An infection. ?An overactive thyroid (hyperthyroidism). ?A lack of red blood cells (anemia). ?What are the signs or symptoms? ?Symptoms of this condition include: ?A feeling that the heart is beating quickly (palpitations). ?Suddenly noticing your heartbeat (cardiac awareness). ?Dizziness. ?Tiredness (fatigue). ?Shortness of breath. ?Chest pain. ?Nausea. ?Fainting. ?How is this diagnosed? ?This condition is diagnosed with: ?A physical exam. ?Other tests, such as: ?Blood tests. ?An electrocardiogram (ECG). This test measures the electrical activity of the heart. ?Ambulatory cardiac monitor. This records your heartbeats for 24 hours or more. ?You may be referred to a heart specialist (cardiologist). ?How is this treated? ?Treatment for this condition depends on the cause or the underlying condition. Treatment may involve: ?Treating the  underlying condition. ?Taking new medicines or changing your current medicines as told by your health care provider. ?Making changes to your diet or lifestyle. ?Follow these instructions at home: ?Lifestyle ? ?Do not use any products that contain nicotine or tobacco, such as cigarettes and e-cigarettes. If you need help quitting, ask your health care provider. ?Do not use illegal drugs, such as cocaine. ?Learn relaxation methods to help you when you get stressed or anxious. These include deep breathing. ?Avoid caffeine or other stimulants. ?Alcohol use ? ?Do not drink alcohol if: ?Your health care provider tells you not to drink. ?You are pregnant, may be pregnant, or are planning to become pregnant. ?If you drink alcohol, limit how much you have: ?0-1 drink a day for women. ?0-2 drinks a day for men. ?Be aware of how much alcohol is in your drink. In the [place] one drink equals one typical bottle of beer (12 oz), one-half glass of wine (5 oz), or one shot of hard liquor (1? oz). ?General instructions ?Drink enough fluids to keep your urine pale yellow. ?Take over-the-counter and prescription medicines only as told by your health care provider. ?Keep all follow-up visits as told by your health care provider. This is important. ?Contact a health care provider if you have: ?A fever. ?Vomiting or diarrhea that does not go away. ?Get help right away if you: ?Have pain in your chest, upper arms, jaw, or neck. ?Become weak or dizzy. ?Feel faint. ?Have palpitations that do not go away. ?Summary ?In sinus tachycardia, the heart beats more than 100 times a minute. ?Sinus tachycardia may be harmless, or it may be a sign of a serious condition. ?Treatment for this condition depends on the cause or the underlying condition. ?Get  help right away if you have pain in your chest, upper arms, jaw, or neck. ?This information is not intended to replace advice given to you by your health care provider. Make sure you discuss any  questions you have with your health care provider. ?Document Revised: 10/27/2020 Document Reviewed: 10/27/2020 ?Elsevier Patient Education ? 2023 [employer] ? ?

## 2021-10-19 ENCOUNTER — Telehealth: Payer: Self-pay | Admitting: Family Medicine

## 2021-10-19 LAB — T3, FREE: T3, Free: 3.2 pg/mL (ref 2.3–4.2)

## 2021-10-19 LAB — TSH: TSH: 0.98 mIU/L (ref 0.40–4.50)

## 2021-10-19 LAB — IRON,TIBC AND FERRITIN PANEL
%SAT: 16 % (calc) (ref 16–45)
Ferritin: 44 ng/mL (ref 16–288)
Iron: 57 ug/dL (ref 45–160)
TIBC: 349 mcg/dL (calc) (ref 250–450)

## 2021-10-19 NOTE — Telephone Encounter (Signed)
Please call patient: ?Her thyroid levels are normal and not the cause of her fast heart rate. ?Iron levels are normal ? ?I would recommend she take the Inderal prescribed and follow-up in 5.5 months, sooner if needed. ?

## 2021-10-19 NOTE — Telephone Encounter (Signed)
Spoke with pt regarding labs and instructions.   

## 2021-10-20 ENCOUNTER — Encounter: Payer: Self-pay | Admitting: Family Medicine

## 2021-10-20 DIAGNOSIS — R000 Tachycardia, unspecified: Secondary | ICD-10-CM | POA: Insufficient documentation

## 2021-10-31 DIAGNOSIS — M329 Systemic lupus erythematosus, unspecified: Secondary | ICD-10-CM | POA: Diagnosis not present

## 2021-10-31 DIAGNOSIS — Z79899 Other long term (current) drug therapy: Secondary | ICD-10-CM | POA: Diagnosis not present

## 2021-10-31 DIAGNOSIS — M2550 Pain in unspecified joint: Secondary | ICD-10-CM | POA: Diagnosis not present

## 2021-11-15 DIAGNOSIS — M5412 Radiculopathy, cervical region: Secondary | ICD-10-CM | POA: Diagnosis not present

## 2021-11-15 DIAGNOSIS — Z79891 Long term (current) use of opiate analgesic: Secondary | ICD-10-CM | POA: Diagnosis not present

## 2021-11-15 DIAGNOSIS — G894 Chronic pain syndrome: Secondary | ICD-10-CM | POA: Diagnosis not present

## 2021-11-15 DIAGNOSIS — Z5181 Encounter for therapeutic drug level monitoring: Secondary | ICD-10-CM | POA: Diagnosis not present

## 2021-11-15 DIAGNOSIS — M5416 Radiculopathy, lumbar region: Secondary | ICD-10-CM | POA: Diagnosis not present

## 2021-12-02 DIAGNOSIS — M5416 Radiculopathy, lumbar region: Secondary | ICD-10-CM | POA: Diagnosis not present

## 2022-02-16 DIAGNOSIS — L03115 Cellulitis of right lower limb: Secondary | ICD-10-CM | POA: Diagnosis not present

## 2022-02-16 DIAGNOSIS — Z23 Encounter for immunization: Secondary | ICD-10-CM | POA: Diagnosis not present

## 2022-02-16 DIAGNOSIS — S81011A Laceration without foreign body, right knee, initial encounter: Secondary | ICD-10-CM | POA: Diagnosis not present

## 2022-02-22 DIAGNOSIS — M329 Systemic lupus erythematosus, unspecified: Secondary | ICD-10-CM | POA: Diagnosis not present

## 2022-02-22 DIAGNOSIS — M2550 Pain in unspecified joint: Secondary | ICD-10-CM | POA: Diagnosis not present

## 2022-02-22 DIAGNOSIS — Z79899 Other long term (current) drug therapy: Secondary | ICD-10-CM | POA: Diagnosis not present

## 2022-02-23 ENCOUNTER — Telehealth: Payer: Self-pay

## 2022-02-23 DIAGNOSIS — Z1231 Encounter for screening mammogram for malignant neoplasm of breast: Secondary | ICD-10-CM

## 2022-02-23 NOTE — Telephone Encounter (Signed)
Spoke with pt and would like to have mammogram and [facility].

## 2022-03-01 DIAGNOSIS — D729 Disorder of white blood cells, unspecified: Secondary | ICD-10-CM | POA: Diagnosis not present

## 2022-03-01 DIAGNOSIS — D721 Eosinophilia, unspecified: Secondary | ICD-10-CM | POA: Diagnosis not present

## 2022-03-01 DIAGNOSIS — M329 Systemic lupus erythematosus, unspecified: Secondary | ICD-10-CM | POA: Diagnosis not present

## 2022-03-13 ENCOUNTER — Telehealth: Payer: Self-pay | Admitting: Family Medicine

## 2022-03-13 NOTE — Telephone Encounter (Signed)
Scheduled  medicare well for 9/26

## 2022-03-15 DIAGNOSIS — G894 Chronic pain syndrome: Secondary | ICD-10-CM | POA: Diagnosis not present

## 2022-03-15 DIAGNOSIS — M5030 Other cervical disc degeneration, unspecified cervical region: Secondary | ICD-10-CM | POA: Diagnosis not present

## 2022-03-15 DIAGNOSIS — M5416 Radiculopathy, lumbar region: Secondary | ICD-10-CM | POA: Diagnosis not present

## 2022-03-15 DIAGNOSIS — M5136 Other intervertebral disc degeneration, lumbar region: Secondary | ICD-10-CM | POA: Diagnosis not present

## 2022-03-15 DIAGNOSIS — M5412 Radiculopathy, cervical region: Secondary | ICD-10-CM | POA: Diagnosis not present

## 2022-03-26 NOTE — Patient Instructions (Signed)

## 2022-03-26 NOTE — Progress Notes (Unsigned)
Subjective:   Monica Schwartz is a 71 y.o. female who presents for Medicare Annual (Subsequent) preventive examination.  Review of Systems    ***       Objective:    There were no vitals filed for this visit. There is no height or weight on file to calculate BMI.      No data to display           Current Medications (verified) Outpatient Encounter Medications as of 03/27/2022  Medication Sig   albuterol (VENTOLIN HFA) 108 (90 Base) MCG/ACT inhaler Inhale 2 puffs into the lungs every 6 (six) hours as needed for wheezing or shortness of breath.   aspirin EC 81 MG tablet Take 81 mg by mouth.   Cholecalciferol 50 MCG (2000 UT) TABS 1 tab daily with food   CLARITIN-D 24 HOUR 10-240 MG 24 hr tablet Take 1 tablet by mouth daily.   Cyanocobalamin 1000 MCG SUBL 1000 mcg sublingual daily   Ferrous Sulfate (IRON) 325 (65 Fe) MG TABS Take 1 tablet by mouth daily.   fluticasone (FLONASE) 50 MCG/ACT nasal spray Place 2 sprays into both nostrils daily. 2 sprays each nostril at night   folic acid (FOLVITE) 1 MG tablet Take 1 tablet by mouth daily.   gabapentin (NEURONTIN) 100 MG capsule Take 100 mg by mouth daily.   HYDROcodone-acetaminophen (NORCO) 10-325 MG tablet Take 1 tablet by mouth 4 (four) times daily as needed.   hydroxychloroquine (PLAQUENIL) 200 MG tablet Take 1.5 tablets by mouth daily.   predniSONE (DELTASONE) 5 MG tablet Take 5 mg by mouth daily.   propranolol ER (INDERAL LA) 60 MG 24 hr capsule Take 1 capsule (60 mg total) by mouth daily.   sulfaSALAzine (AZULFIDINE) 500 MG tablet Take by mouth at bedtime.   traZODone (DESYREL) 100 MG tablet TAKE TWO TABLETS BY MOUTH AT BEDTIME AS NEEDED FOR SLEEP   triamcinolone cream (KENALOG) 0.1 % APPLY AND RUB IN A THIN FILM TO AFFECTED AREAS TWICE DAILY.(AM AND PM).   No facility-administered encounter medications on file as of 03/27/2022.    Allergies (verified) Penicillins, Fosamax [alendronate sodium], Quetiapine, and Seroquel  [quetiapine fumarate]   History: Past Medical History:  Diagnosis Date   Allergy    Anemia    Anxiety    Asthma    Colon polyp    benign   Constipation    Diverticulosis    Dysphagia    Fatigue    Fatty liver    Gait instability 02/03/2021   GERD (gastroesophageal reflux disease)    H/O seasonal allergies    Hiatal hernia    History of tremor 09/2013   Hyperlipidemia    Hypertension    Leucocytosis    Lymphocytosis    Major depressive disorder    with anxiety   Microscopic polyangiitis (HCC)    Migraine    Migraine headache 09/27/2015   Myalgia    Odynophagia    Osteoarthritis    Raynauds syndrome    Systemic lupus (HCC)    + ANA/DS-DNA, anti PR-3ab (neg C-ANCA), anti- cardiolipin IGM. Chronic leukocytosis and mild eosinophilia.    Past Surgical History:  Procedure Laterality Date   ABDOMINAL HYSTERECTOMY  1989   with removal of ovaries   APPENDECTOMY  1989   CESAREAN SECTION  1984   COLONOSCOPY  2014   LAPAROSCOPIC ABDOMINAL EXPLORATION     x2 for endometrosis 1980 and 1984   TONSILLECTOMY AND ADENOIDECTOMY  1970   Family History  Problem Relation Age of Onset   Stroke Mother    Arthritis Mother    Depression Mother    Alcohol abuse Father    COPD Father    Hearing loss Father    Arthritis Brother    Depression Brother    Parkinson's disease Brother    Depression Son    Breast cancer Paternal Aunt    Arthritis Paternal Aunt    Social History   Socioeconomic History   Marital status: Widowed    Spouse name: Not on file   Number of children: 1   Years of education: 14   Highest education level: Not on file  Occupational History   Occupation: retired  Tobacco Use   Smoking status: Former    Packs/day: 0.50    Years: 10.00    Total pack years: 5.00    Types: Cigarettes   Smokeless tobacco: Never  Vaping Use   Vaping Use: Never used  Substance and Sexual Activity   Alcohol use: Yes    Comment: occasional   Drug use: No   Sexual activity:  Never  Other Topics Concern   Not on file  Social History Narrative   Recently widowed March 2018. Has 2 children, one living named [place].   She is retired Sales promotion account executive.   She drinks caffeine. Takes a daily vitamin.   Wears her seatbelt, wears a bicycle helmet.   Routinely exercises.   Smoke detector in the home.   Feels safe in her relationships.   Social Determinants of Health   Financial Resource Strain: Low Risk  (03/26/2021)   Overall Financial Resource Strain (CARDIA)    Difficulty of Paying Living Expenses: Not hard at all  Food Insecurity: Food Insecurity Present (03/26/2021)   Hunger Vital Sign    Worried About Running Out of Food in the Last Year: Sometimes true    Ran Out of Food in the Last Year: Never true  Transportation Needs: No Transportation Needs (03/26/2021)   PRAPARE - Administrator, Civil Service (Medical): No    Lack of Transportation (Non-Medical): No  Physical Activity: Not on file  Stress: No Stress Concern Present (03/26/2021)   [employer] of Occupational Health - Occupational Stress Questionnaire    Feeling of Stress : Not at all  Social Connections: Socially Isolated (03/26/2021)   Social Connection and Isolation Panel [NHANES]    Frequency of Communication with Friends and Family: More than three times a week    Frequency of Social Gatherings with Friends and Family: More than three times a week    Attends Religious Services: Never    Database administrator or Organizations: No    Attends Banker Meetings: Never    Marital Status: Widowed    Tobacco Counseling Counseling given: Not Answered   Clinical Intake:                 Diabetic?***         Activities of Daily Living     No data to display           Patient Care Team: Natalia Leatherwood, DO as PCP - General (Family Medicine) Francee Gentile, MD (Internal Medicine)  Indicate any recent Medical Services you may  have received from other than [facility] providers in the past year (date may be approximate).     Assessment:   This is a routine wellness examination for [facility].  Hearing/Vision screen No results found.  Dietary issues  and exercise activities discussed:     Goals Addressed   None   Depression Screen    10/18/2021    1:42 PM 03/26/2021    8:46 AM 02/03/2021    1:10 PM 05/31/2020   10:09 AM 12/10/2019   10:12 AM 03/24/2019    9:46 AM 09/18/2018   10:19 AM  PHQ 2/9 Scores  PHQ - 2 Score 2 1 0 4 2 4 2   PHQ- 9 Score 9 1 2 7 10 20 5     Fall Risk    10/18/2021    1:42 PM 03/26/2021    8:44 AM 05/31/2020   10:09 AM 03/24/2019    9:53 AM 11/26/2017    2:20 PM  [place] in the past year? 0 1 0 0 Yes  Number falls in past yr: 0 1 0 0 2 or more  Injury with Fall? 0 1 0 0 Yes  Risk Factor Category      High Fall Risk  Risk for fall due to : No Fall Risks History of fall(s)   History of fall(s);Other (Comment)  Risk for fall due to: Comment     vertigo  Follow up Falls evaluation completed Education provided;Falls prevention discussed Falls evaluation completed Falls evaluation completed     FALL RISK PREVENTION PERTAINING TO THE HOME:  Any stairs in or around the home? {YES/NO:21197} If so, are there any without handrails? {YES/NO:21197} Home free of loose throw rugs in walkways, pet beds, electrical cords, etc? {YES/NO:21197} Adequate lighting in your home to reduce risk of falls? {YES/NO:21197}  ASSISTIVE DEVICES UTILIZED TO PREVENT FALLS:  Life alert? {YES/NO:21197} Use of a cane, walker or w/c? {YES/NO:21197} Grab bars in the bathroom? {YES/NO:21197} Shower chair or bench in shower? {YES/NO:21197} Elevated toilet seat or a handicapped toilet? {YES/NO:21197}  TIMED UP AND GO:  Was the test performed? {YES/NO:21197}.  Length of time to ambulate 10 feet: *** sec.   {Appearance of EVOJ:5009381}  Cognitive Function:        03/26/2021    8:48 AM  6CIT Screen  What  Year? 0 points  What month? 0 points  What time? 0 points  Count back from 20 0 points  Months in reverse 0 points  Repeat phrase 6 points  Total Score 6 points    Immunizations Immunization History  Administered Date(s) Administered   Fluad Quad(high Dose 65+) 03/24/2019   Influenza Split 04/05/2016   Influenza, High Dose Seasonal PF 03/20/2018, 04/14/2020   Influenza,inj,Quad PF,6+ Mos 03/22/2017   Influenza-Unspecified 05/01/2021   PFIZER(Purple Top)SARS-COV-2 Vaccination 01/05/2020, 01/26/2020   Pfizer Covid-19 Vaccine Bivalent Booster 56yrs & up 05/01/2021   Pneumococcal Conjugate-13 06/21/2015   Pneumococcal Polysaccharide-23 09/18/2018   Tdap 01/25/2014   Zoster Recombinat (Shingrix) 05/06/2019, 05/06/2019, 09/16/2019    TDAP status: Up to date  Flu Vaccine status: Due, Education has been provided regarding the importance of this vaccine. Advised may receive this vaccine at local pharmacy or Health Dept. Aware to provide a copy of the vaccination record if obtained from local pharmacy or Health Dept. Verbalized acceptance and understanding.  Pneumococcal vaccine status: Up to date  Covid-19 vaccine status: Completed vaccines  Qualifies for Shingles Vaccine? Yes   Zostavax completed No   Shingrix Completed?: Yes  Screening Tests Health Maintenance  Topic Date Due   MAMMOGRAM  Never done   COVID-19 Vaccine (4 - Pfizer risk series) 06/26/2021   INFLUENZA VACCINE  01/30/2022   COLONOSCOPY (Pts 45-66yrs Insurance coverage will  need to be confirmed)  07/02/2022   TETANUS/TDAP  01/26/2024   DEXA SCAN  09/06/2027   Pneumonia Vaccine 35+ Years old  Completed   Hepatitis C Screening  Completed   Zoster Vaccines- Shingrix  Completed   HPV VACCINES  Aged Out    Health Maintenance  Health Maintenance Due  Topic Date Due   MAMMOGRAM  Never done   COVID-19 Vaccine (4 - Pfizer risk series) 06/26/2021   INFLUENZA VACCINE  01/30/2022    Colorectal cancer screening:  Type of screening: Colonoscopy. Completed 07/02/2012. Repeat every 10 years  Mammogram status: Ordered 02/23/22. Pt provided with contact info and advised to call to schedule appt.   Bone Density status: Completed 09/05/2017. Results reflect: Bone density results: NORMAL. Repeat every 2 years.  Lung Cancer Screening: (Low Dose CT Chest recommended if Age 6-80 years, 30 pack-year currently smoking OR have quit w/in 15years.) does qualify.   Lung Cancer Screening Referral: ***  Additional Screening:  Hepatitis C Screening: does qualify; Completed 07/02/2014  Vision Screening: Recommended annual ophthalmology exams for early detection of glaucoma and other disorders of the eye. Is the patient up to date with their annual eye exam?  {YES/NO:21197} Who is the provider or what is the name of the office in which the patient attends annual eye exams? *** If pt is not established with a provider, would they like to be referred to a provider to establish care? {YES/NO:21197}.   Dental Screening: Recommended annual dental exams for proper oral hygiene  Community Resource Referral / Chronic Care Management: CRR required this visit?  {YES/NO:21197}  CCM required this visit?  {YES/NO:21197}     Plan:     I have personally reviewed and noted the following in the patient's chart:   Medical and social history Use of alcohol, tobacco or illicit drugs  Current medications and supplements including opioid prescriptions. Patient is currently taking opioid prescriptions. Information provided to patient regarding non-opioid alternatives. Patient advised to discuss non-opioid treatment plan with their provider. Functional ability and status Nutritional status Physical activity Advanced directives List of other physicians Hospitalizations, surgeries, and ER visits in previous 12 months Vitals Screenings to include cognitive, depression, and falls Referrals and appointments  In addition, I have  reviewed and discussed with patient certain preventive protocols, quality metrics, and best practice recommendations. A written personalized care plan for preventive services as well as general preventive health recommendations were provided to patient.     Filomena Jungling, CMA   03/26/2022   Nurse Notes: ***

## 2022-03-27 ENCOUNTER — Ambulatory Visit (INDEPENDENT_AMBULATORY_CARE_PROVIDER_SITE_OTHER): Payer: Medicare Other

## 2022-03-27 DIAGNOSIS — Z0000 Encounter for general adult medical examination without abnormal findings: Secondary | ICD-10-CM | POA: Diagnosis not present

## 2022-04-17 ENCOUNTER — Ambulatory Visit (INDEPENDENT_AMBULATORY_CARE_PROVIDER_SITE_OTHER): Payer: Medicare Other | Admitting: Family Medicine

## 2022-04-17 ENCOUNTER — Encounter: Payer: Self-pay | Admitting: Family Medicine

## 2022-04-17 VITALS — BP 112/75 | HR 78 | Temp 98.6°F | Wt 148.0 lb

## 2022-04-17 DIAGNOSIS — F419 Anxiety disorder, unspecified: Secondary | ICD-10-CM

## 2022-04-17 DIAGNOSIS — Z23 Encounter for immunization: Secondary | ICD-10-CM | POA: Diagnosis not present

## 2022-04-17 DIAGNOSIS — E782 Mixed hyperlipidemia: Secondary | ICD-10-CM | POA: Diagnosis not present

## 2022-04-17 DIAGNOSIS — R000 Tachycardia, unspecified: Secondary | ICD-10-CM | POA: Diagnosis not present

## 2022-04-17 DIAGNOSIS — I10 Essential (primary) hypertension: Secondary | ICD-10-CM

## 2022-04-17 DIAGNOSIS — F332 Major depressive disorder, recurrent severe without psychotic features: Secondary | ICD-10-CM

## 2022-04-17 LAB — CBC WITH DIFFERENTIAL/PLATELET
Basophils Absolute: 0.1 10*3/uL (ref 0.0–0.1)
Basophils Relative: 0.9 % (ref 0.0–3.0)
Eosinophils Absolute: 0.7 10*3/uL (ref 0.0–0.7)
Eosinophils Relative: 7 % — ABNORMAL HIGH (ref 0.0–5.0)
HCT: 39.1 % (ref 36.0–46.0)
Hemoglobin: 12.9 g/dL (ref 12.0–15.0)
Lymphocytes Relative: 33.7 % (ref 12.0–46.0)
Lymphs Abs: 3.5 10*3/uL (ref 0.7–4.0)
MCHC: 32.9 g/dL (ref 30.0–36.0)
MCV: 99.7 fl (ref 78.0–100.0)
Monocytes Absolute: 0.7 10*3/uL (ref 0.1–1.0)
Monocytes Relative: 6.4 % (ref 3.0–12.0)
Neutro Abs: 5.3 10*3/uL (ref 1.4–7.7)
Neutrophils Relative %: 52 % (ref 43.0–77.0)
Platelets: 274 10*3/uL (ref 150.0–400.0)
RBC: 3.92 Mil/uL (ref 3.87–5.11)
RDW: 14.2 % (ref 11.5–15.5)
WBC: 10.3 10*3/uL (ref 4.0–10.5)

## 2022-04-17 LAB — BASIC METABOLIC PANEL
BUN: 14 mg/dL (ref 6–23)
CO2: 27 mEq/L (ref 19–32)
Calcium: 9.3 mg/dL (ref 8.4–10.5)
Chloride: 102 mEq/L (ref 96–112)
Creatinine, Ser: 0.8 mg/dL (ref 0.40–1.20)
GFR: 74.96 mL/min (ref >60.00)
Glucose, Bld: 153 mg/dL — ABNORMAL HIGH (ref 70–99)
Potassium: 3.7 mEq/L (ref 3.5–5.1)
Sodium: 137 mEq/L (ref 135–145)

## 2022-04-17 MED ORDER — TRAZODONE HCL 100 MG PO TABS: 100.0000 mg | ORAL_TABLET | Freq: Every day | ORAL | 1 refills | Status: DC

## 2022-04-17 MED ORDER — PROPRANOLOL HCL ER 60 MG PO CP24: 60.0000 mg | ORAL_CAPSULE | Freq: Every day | ORAL | 1 refills | Status: DC

## 2022-04-17 NOTE — Progress Notes (Signed)
Monica Schwartz , 1950-07-16, 71 y.o., female MRN: [id number] Patient Care Team    Relationship Specialty Notifications Start End  Natalia Leatherwood, DO PCP - General Family Medicine  10/02/16   Francee Gentile, MD  Internal Medicine  10/02/16    Comment: rheumatologist- SLE    Chief Complaint  Patient presents with   Hypertension    Pt is not fasting     Subjective: Monica Schwartz is a 71 y.o. female present for [facility] Essential hypertension/HLD /tachycardia:  Pt reports compliance with Inderal 60.Her blood pressures are normal today. Patient denies chest pain, shortness of breath, dizziness or lower extremity edema.   Pt takes a daily baby ASA. Pt is not prescribed statin.  She is on chronic steroids for her rheumatological disorder. Diet: low sodium Exercise: routinely RF: HTN, HLD, Lupus, FHX stroke   Depression/anxiety:  Pt reports she is doing okay for her depression anxiety standpoint.  She is now being managed by her psychiatry team, but is wishing to transfer back since her psychiatrist is no longer in her network.  She currently is only taking trazodone 100 mg at night.  The Lexapro has been discontinued and she feels like she is doing well.       04/17/2022    9:21 AM 03/27/2022   10:35 AM 10/18/2021    1:42 PM 03/26/2021    8:46 AM 02/03/2021    1:10 PM  Depression screen PHQ 2/9  Decreased Interest 1 0 1 0 0  Down, Depressed, Hopeless 1 0 1 1 0  PHQ - 2 Score 2 0 2 1 0  Altered sleeping 1  1 0 0  Tired, decreased energy 1  1 0 1  Change in appetite 1  1 0 1  Feeling bad or failure about yourself  1  1 0 0  Trouble concentrating 1  1 0 0  Moving slowly or fidgety/restless 0  1 0 0  Suicidal thoughts 0  1 0 0  PHQ-9 Score 7  9 1 2     Allergies  Allergen Reactions   Penicillins Shortness Of Breath, Rash and Swelling   Fosamax [Alendronate Sodium]    Quetiapine Other (See Comments)    Tremor   Seroquel [Quetiapine Fumarate] Other (See Comments)     Tremor   Social History   Social History Narrative   Recently widowed March 2018. Has 2 children, one living named [place].   She is retired [employer].   She drinks caffeine. Takes a daily vitamin.   Wears her seatbelt, wears a bicycle helmet.   Routinely exercises.   Smoke detector in the home.   Feels safe in her relationships.   Past Medical History:  Diagnosis Date   Allergy    Anemia    Anxiety    Asthma    Colon polyp    benign   Constipation    Diverticulosis    Dysphagia    Fatigue    Fatty liver    Gait instability 02/03/2021   GERD (gastroesophageal reflux disease)    H/O seasonal allergies    Hiatal hernia    History of tremor 09/2013   Hyperlipidemia    Hypertension    Leucocytosis    Lymphocytosis    Major depressive disorder    with anxiety   Microscopic polyangiitis (HCC)    Migraine    Migraine headache 09/27/2015   Myalgia    Odynophagia    Osteoarthritis  Raynauds syndrome    Systemic lupus (HCC)    + ANA/DS-DNA, anti PR-3ab (neg C-ANCA), anti- cardiolipin IGM. Chronic leukocytosis and mild eosinophilia.    Past Surgical History:  Procedure Laterality Date   ABDOMINAL HYSTERECTOMY  1989   with removal of ovaries   APPENDECTOMY  1989   CESAREAN SECTION  1984   COLONOSCOPY  2014   LAPAROSCOPIC ABDOMINAL EXPLORATION     x2 for endometrosis 1980 and 1984   TONSILLECTOMY AND ADENOIDECTOMY  1970   Family History  Problem Relation Age of Onset   Stroke Mother    Arthritis Mother    Depression Mother    Alcohol abuse Father    COPD Father    Hearing loss Father    Arthritis Brother    Depression Brother    Parkinson's disease Brother    Depression Son    Breast cancer Paternal Aunt    Arthritis Paternal Aunt    Allergies as of 04/17/2022       Reactions   Penicillins Shortness Of Breath, Rash, Swelling   Fosamax [alendronate Sodium]    Quetiapine Other (See Comments)   Tremor   Seroquel [quetiapine Fumarate]  Other (See Comments)   Tremor        Medication List        Accurate as of April 17, 2022  9:50 AM. If you have any questions, ask your nurse or doctor.          STOP taking these medications    amLODipine 5 MG tablet Commonly known as: NORVASC Stopped by: Felix Pacini, DO       TAKE these medications    albuterol 108 (90 Base) MCG/ACT inhaler Commonly known as: VENTOLIN HFA Inhale 2 puffs into the lungs every 6 (six) hours as needed for wheezing or shortness of breath.   aspirin EC 81 MG tablet Take 81 mg by mouth.   Cholecalciferol 50 MCG (2000 UT) Tabs 1 tab daily with food   Claritin-D 24 Hour 10-240 MG 24 hr tablet Generic drug: loratadine-pseudoephedrine Take 1 tablet by mouth daily.   colchicine 0.6 MG tablet Take 1 tablet (0.6 mg total) by mouth 2 times daily.   Cyanocobalamin 1000 MCG Subl 1000 mcg sublingual daily   fluticasone 50 MCG/ACT nasal spray Commonly known as: FLONASE Place 2 sprays into both nostrils daily. 2 sprays each nostril at night   folic acid 1 MG tablet Commonly known as: FOLVITE Take 1 tablet by mouth daily.   gabapentin 100 MG capsule Commonly known as: NEURONTIN Take 100 mg by mouth daily.   HYDROcodone-acetaminophen 10-325 MG tablet Commonly known as: NORCO Take 1 tablet by mouth 4 (four) times daily as needed.   hydroxychloroquine 200 MG tablet Commonly known as: PLAQUENIL Take 1.5 tablets by mouth daily.   Iron 325 (65 Fe) MG Tabs Take 1 tablet by mouth daily.   predniSONE 5 MG tablet Commonly known as: DELTASONE Take 5 mg by mouth daily.   propranolol ER 60 MG 24 hr capsule Commonly known as: INDERAL LA Take 1 capsule (60 mg total) by mouth daily.   sulfaSALAzine 500 MG tablet Commonly known as: AZULFIDINE Take by mouth at bedtime.   traZODone 100 MG tablet Commonly known as: DESYREL Take 1 tablet (100 mg total) by mouth at bedtime. What changed: See the new instructions. Changed by: Felix Pacini, DO   triamcinolone cream 0.1 % Commonly known as: KENALOG APPLY AND RUB IN A THIN FILM TO AFFECTED AREAS TWICE  DAILY.(AM AND PM).        All past medical history, surgical history, allergies, family history, immunizations andmedications were updated in the EMR today and reviewed under the history and medication portions of their EMR.     ROS: Negative, with the exception of above mentioned in HPI   Objective:  BP 112/75   Pulse 78   Temp 98.6 F (37 C)   Wt 148 lb (67.1 kg)   SpO2 (!) 78%   BMI 28.90 kg/m  Body mass index is 28.9 kg/m. Physical Exam Vitals and nursing note reviewed.  Constitutional:      General: She is not in acute distress.    Appearance: Normal appearance. She is not ill-appearing, toxic-appearing or diaphoretic.  HENT:     Head: Normocephalic and atraumatic.  Eyes:     General: No scleral icterus.       Right eye: No discharge.        Left eye: No discharge.     Extraocular Movements: Extraocular movements intact.     Conjunctiva/sclera: Conjunctivae normal.     Pupils: Pupils are equal, round, and reactive to light.  Cardiovascular:     Rate and Rhythm: Normal rate and regular rhythm.  Pulmonary:     Effort: Pulmonary effort is normal. No respiratory distress.     Breath sounds: Normal breath sounds. No wheezing, rhonchi or rales.  Musculoskeletal:     Cervical back: Neck supple. No tenderness.     Right lower leg: No edema.     Left lower leg: No edema.  Lymphadenopathy:     Cervical: No cervical adenopathy.  Skin:    General: Skin is warm and dry.     Coloration: Skin is not jaundiced or pale.     Findings: No erythema or rash.  Neurological:     Mental Status: She is alert and oriented to person, place, and time. Mental status is at baseline.     Motor: No weakness.     Gait: Gait normal.  Psychiatric:        Mood and Affect: Mood normal.        Behavior: Behavior normal.        Thought Content: Thought content normal.         Judgment: Judgment normal.     Assessment/Plan: Monica Schwartz is a 71 y.o. female present for OV for  Systemic lupus erythematosus, unspecified SLE type, unspecified organ involvement status (HCC) Est with rheumatology She is prescribed low-dose steroids chronically and pain meds  Tachycardia/hyperlipidemia: Goal Continue Inderal LA 60 mg daily. CBC and BMP collected today Follow-up in 5.5 months  Anxiety: Agreed to take over trazodone 100 mg nightly. Lexapro was discontinued by psychiatry team of the last few months and she feels she is doing well.  Follow-up 5.5 months Influenza vaccine administered today  Reviewed expectations re: course of current medical issues. Discussed self-management of symptoms. Outlined signs and symptoms indicating need for more acute intervention. Patient verbalized understanding and all questions were answered. Patient received an After-Visit Summary.    Orders Placed This Encounter  Procedures   Flu Vaccine QUAD High Dose(Fluad)   Basic Metabolic Panel (BMET)   CBC w/Diff   Meds ordered this encounter  Medications   propranolol ER (INDERAL LA) 60 MG 24 hr capsule    Sig: Take 1 capsule (60 mg total) by mouth daily.    Dispense:  90 capsule    Refill:  1   traZODone (DESYREL)  100 MG tablet    Sig: Take 1 tablet (100 mg total) by mouth at bedtime.    Dispense:  90 tablet    Refill:  1    MUST HAVE OV FOR FURTHER REFILLS   Referral Orders  No referral(s) requested today      Note is dictated utilizing voice recognition software. Although note has been proof read prior to signing, occasional typographical errors still can be missed. If any questions arise, please do not hesitate to call for verification.   electronically signed by:  Howard Pouch, DO  [place]

## 2022-04-20 ENCOUNTER — Inpatient Hospital Stay: Admission: RE | Admit: 2022-04-20 | Payer: Medicare Other | Source: Ambulatory Visit

## 2022-06-12 DIAGNOSIS — M329 Systemic lupus erythematosus, unspecified: Secondary | ICD-10-CM | POA: Diagnosis not present

## 2022-06-12 DIAGNOSIS — M2550 Pain in unspecified joint: Secondary | ICD-10-CM | POA: Diagnosis not present

## 2022-06-12 DIAGNOSIS — Z79899 Other long term (current) drug therapy: Secondary | ICD-10-CM | POA: Diagnosis not present

## 2022-07-19 DIAGNOSIS — M5412 Radiculopathy, cervical region: Secondary | ICD-10-CM | POA: Diagnosis not present

## 2022-07-19 DIAGNOSIS — M5136 Other intervertebral disc degeneration, lumbar region: Secondary | ICD-10-CM | POA: Diagnosis not present

## 2022-07-19 DIAGNOSIS — M5416 Radiculopathy, lumbar region: Secondary | ICD-10-CM | POA: Diagnosis not present

## 2022-07-26 DIAGNOSIS — M5416 Radiculopathy, lumbar region: Secondary | ICD-10-CM | POA: Diagnosis not present

## 2022-08-04 ENCOUNTER — Other Ambulatory Visit: Payer: Self-pay | Admitting: Family Medicine

## 2022-09-25 DIAGNOSIS — M79644 Pain in right finger(s): Secondary | ICD-10-CM | POA: Diagnosis not present

## 2022-09-25 DIAGNOSIS — M2550 Pain in unspecified joint: Secondary | ICD-10-CM | POA: Diagnosis not present

## 2022-09-25 DIAGNOSIS — B372 Candidiasis of skin and nail: Secondary | ICD-10-CM | POA: Diagnosis not present

## 2022-09-25 DIAGNOSIS — M329 Systemic lupus erythematosus, unspecified: Secondary | ICD-10-CM | POA: Diagnosis not present

## 2022-09-25 DIAGNOSIS — Z79899 Other long term (current) drug therapy: Secondary | ICD-10-CM | POA: Diagnosis not present

## 2022-09-25 DIAGNOSIS — M79645 Pain in left finger(s): Secondary | ICD-10-CM | POA: Diagnosis not present

## 2022-10-02 ENCOUNTER — Encounter: Payer: Self-pay | Admitting: Family Medicine

## 2022-10-02 ENCOUNTER — Ambulatory Visit (INDEPENDENT_AMBULATORY_CARE_PROVIDER_SITE_OTHER): Payer: Medicare Other | Admitting: Family Medicine

## 2022-10-02 VITALS — BP 130/84 | HR 63 | Temp 98.0°F | Wt 137.6 lb

## 2022-10-02 DIAGNOSIS — F419 Anxiety disorder, unspecified: Secondary | ICD-10-CM

## 2022-10-02 DIAGNOSIS — F332 Major depressive disorder, recurrent severe without psychotic features: Secondary | ICD-10-CM

## 2022-10-02 DIAGNOSIS — E782 Mixed hyperlipidemia: Secondary | ICD-10-CM | POA: Diagnosis not present

## 2022-10-02 DIAGNOSIS — I10 Essential (primary) hypertension: Secondary | ICD-10-CM

## 2022-10-02 MED ORDER — TRAZODONE HCL 100 MG PO TABS: 100.0000 mg | ORAL_TABLET | Freq: Every day | ORAL | 1 refills | Status: AC

## 2022-10-02 MED ORDER — PROPRANOLOL HCL ER 60 MG PO CP24: 60.0000 mg | ORAL_CAPSULE | Freq: Every day | ORAL | 1 refills | Status: AC

## 2022-10-02 MED ORDER — ALBUTEROL SULFATE HFA 108 (90 BASE) MCG/ACT IN AERS: 2.0000 | INHALATION_SPRAY | Freq: Four times a day (QID) | RESPIRATORY_TRACT | 11 refills | Status: AC | PRN

## 2022-10-02 NOTE — Patient Instructions (Addendum)
Return in about 24 weeks (around 03/19/2023) for cpe (20 min), Routine chronic condition follow-up.        Great to see you today.  I have refilled the medication(s) we provide.   If labs were collected, we will inform you of lab results once received either by echart message or telephone call.   - echart message- for normal results that have been seen by the patient already.   - telephone call: abnormal results or if patient has not viewed results in their echart.

## 2022-10-02 NOTE — Progress Notes (Signed)
Monica Schwartz , 1950/09/19, 72 y.o., female MRN: [id number] Patient Care Team    Relationship Specialty Notifications Start End  Ma Hillock, DO PCP - General Family Medicine  10/02/16   Hermelinda Medicus, MD  Internal Medicine  10/02/16    Comment: rheumatologist- SLE    Chief Complaint  Patient presents with   Hypertension     Subjective: Monica Schwartz is a 72 y.o. female present for [facility] Essential hypertension/HLD /tachycardia:  Pt reports compliance  with Inderal 60.Her blood pressures are normal today. Patient denies chest pain, shortness of breath, dizziness or lower extremity edema.   Pt takes a daily baby ASA. Pt is not prescribed statin.  She is on chronic steroids for her rheumatological disorder. Diet: low sodium Exercise: routinely RF: HTN, HLD, Lupus, FHX stroke   Depression/anxiety:  Pt reports she is feeling well with her depression anxiety standpoint.  She is compliant with trazodone 100 mg at night.  The Lexapro was discontinued and she feels like she is doing well.       04/17/2022    9:21 AM 03/27/2022   10:35 AM 10/18/2021    1:42 PM 03/26/2021    8:46 AM 02/03/2021    1:10 PM  Depression screen PHQ 2/9  Decreased Interest 1 0 1 0 0  Down, Depressed, Hopeless 1 0 1 1 0  PHQ - 2 Score 2 0 2 1 0  Altered sleeping 1  1 0 0  Tired, decreased energy 1  1 0 1  Change in appetite 1  1 0 1  Feeling bad or failure about yourself  1  1 0 0  Trouble concentrating 1  1 0 0  Moving slowly or fidgety/restless 0  1 0 0  Suicidal thoughts 0  1 0 0  PHQ-9 Score 7  9 1 2     Allergies  Allergen Reactions   Penicillins Shortness Of Breath, Rash and Swelling   Fosamax [Alendronate Sodium]    Quetiapine Other (See Comments)    Tremor   Seroquel [Quetiapine Fumarate] Other (See Comments)    Tremor   Social History   Social History Narrative   Recently widowed March 2018. Has 2 children, one living named [place].   She is retired Conservator, museum/gallery.   She drinks caffeine. Takes a daily vitamin.   Wears her seatbelt, wears a bicycle helmet.   Routinely exercises.   Smoke detector in the home.   Feels safe in her relationships.   Past Medical History:  Diagnosis Date   Allergy    Anemia    Anxiety    Asthma    Colon polyp    benign   Constipation    Diverticulosis    Dysphagia    Fatigue    Fatty liver    Gait instability 02/03/2021   GERD (gastroesophageal reflux disease)    H/O seasonal allergies    Hiatal hernia    History of tremor 09/2013   Hyperlipidemia    Hypertension    Leucocytosis    Lymphocytosis    Major depressive disorder    with anxiety   Microscopic polyangiitis    Migraine    Migraine headache 09/27/2015   Myalgia    Odynophagia    Osteoarthritis    Raynauds syndrome    Systemic lupus    + ANA/DS-DNA, anti PR-3ab (neg C-ANCA), anti- cardiolipin IGM. Chronic leukocytosis and mild eosinophilia.    Past Surgical History:  Procedure  Laterality Date   ABDOMINAL HYSTERECTOMY  1989   with removal of ovaries   APPENDECTOMY  [place]   COLONOSCOPY  2014   LAPAROSCOPIC ABDOMINAL EXPLORATION     x2 for endometrosis 1980 and [place]   Family History  Problem Relation Age of Onset   Stroke Mother    Arthritis Mother    Depression Mother    Alcohol abuse Father    COPD Father    Hearing loss Father    Arthritis Brother    Depression Brother    Parkinson's disease Brother    Depression Son    Breast cancer Paternal Aunt    Arthritis Paternal Aunt    Allergies as of 10/02/2022       Reactions   Penicillins Shortness Of Breath, Rash, Swelling   Fosamax [alendronate Sodium]    Quetiapine Other (See Comments)   Tremor   Seroquel [quetiapine Fumarate] Other (See Comments)   Tremor        Medication List        Accurate as of October 02, 2022 10:14 AM. If you have any questions, ask your nurse or doctor.           albuterol 108 (90 Base) MCG/ACT inhaler Commonly known as: VENTOLIN HFA Inhale 2 puffs into the lungs every 6 (six) hours as needed for wheezing or shortness of breath.   aspirin EC 81 MG tablet Take 81 mg by mouth.   Cholecalciferol 50 MCG (2000 UT) Tabs 1 tab daily with food   Claritin-D 24 Hour 10-240 MG 24 hr tablet Generic drug: loratadine-pseudoephedrine Take 1 tablet by mouth daily.   colchicine 0.6 MG tablet Take 1 tablet (0.6 mg total) by mouth 2 times daily.   Cyanocobalamin 1000 MCG Subl 1000 mcg sublingual daily   fluticasone 50 MCG/ACT nasal spray Commonly known as: FLONASE Place 2 sprays into both nostrils daily. 2 sprays each nostril at night   folic acid 1 MG tablet Commonly known as: FOLVITE Take 1 tablet by mouth daily.   gabapentin 100 MG capsule Commonly known as: NEURONTIN Take 100 mg by mouth daily.   HYDROcodone-acetaminophen 10-325 MG tablet Commonly known as: NORCO Take 1 tablet by mouth 4 (four) times daily as needed.   hydroxychloroquine 200 MG tablet Commonly known as: PLAQUENIL Take 1.5 tablets by mouth daily.   Iron 325 (65 Fe) MG Tabs Take 1 tablet by mouth daily.   predniSONE 5 MG tablet Commonly known as: DELTASONE Take 5 mg by mouth daily.   propranolol ER 60 MG 24 hr capsule Commonly known as: INDERAL LA Take 1 capsule (60 mg total) by mouth daily.   sulfaSALAzine 500 MG tablet Commonly known as: AZULFIDINE Take by mouth at bedtime.   traZODone 100 MG tablet Commonly known as: DESYREL Take 1 tablet (100 mg total) by mouth at bedtime.   triamcinolone cream 0.1 % Commonly known as: KENALOG APPLY AND RUB IN A THIN FILM TO AFFECTED AREAS TWICE DAILY.(AM AND PM).        All past medical history, surgical history, allergies, family history, immunizations andmedications were updated in the EMR today and reviewed under the history and medication portions of their EMR.     ROS: Negative, with the exception of above  mentioned in HPI   Objective:  BP 130/84   Pulse 63   Temp 98 F (36.7 C)   Wt 137 lb 9.6 oz (62.4  kg)   SpO2 98%   BMI 26.87 kg/m  Body mass index is 26.87 kg/m. Physical Exam Vitals and nursing note reviewed.  Constitutional:      General: She is not in acute distress.    Appearance: Normal appearance. She is not ill-appearing, toxic-appearing or diaphoretic.  HENT:     Head: Normocephalic and atraumatic.  Eyes:     General: No scleral icterus.       Right eye: No discharge.        Left eye: No discharge.     Extraocular Movements: Extraocular movements intact.     Conjunctiva/sclera: Conjunctivae normal.     Pupils: Pupils are equal, round, and reactive to light.  Cardiovascular:     Rate and Rhythm: Normal rate and regular rhythm.  Pulmonary:     Effort: Pulmonary effort is normal. No respiratory distress.     Breath sounds: Normal breath sounds.  Musculoskeletal:     Cervical back: Neck supple. No tenderness.     Right lower leg: No edema.     Left lower leg: No edema.  Lymphadenopathy:     Cervical: No cervical adenopathy.  Skin:    General: Skin is warm.     Findings: No rash.  Neurological:     Mental Status: She is alert and oriented to person, place, and time. Mental status is at baseline.  Psychiatric:        Mood and Affect: Mood normal.        Behavior: Behavior normal.        Thought Content: Thought content normal.        Judgment: Judgment normal.     Assessment/Plan: Monica Schwartz is a 72 y.o. female present for OV for  Systemic lupus erythematosus, unspecified SLE type, unspecified organ involvement status ([place]) Est with rheumatology She is prescribed low-dose steroids chronically and pain meds  Tachycardia/hyperlipidemia: Stable Continue Inderal LA 60 mg daily. Labs due next visit  Anxiety: Stable Continue trazodone 100 mg nightly. Lexapro was discontinued by psychiatry team of the last few months and she feels she is doing  well.  Return in about 24 weeks (around 03/19/2023) for cpe (20 min), Routine chronic condition follow-up.  Reviewed expectations re: course of current medical issues. Discussed self-management of symptoms. Outlined signs and symptoms indicating need for more acute intervention. Patient verbalized understanding and all questions were answered. Patient received an After-Visit Summary.    No orders of the defined types were placed in this encounter.  Meds ordered this encounter  Medications   albuterol (VENTOLIN HFA) 108 (90 Base) MCG/ACT inhaler    Sig: Inhale 2 puffs into the lungs every 6 (six) hours as needed for wheezing or shortness of breath.    Dispense:  6.7 g    Refill:  11   propranolol ER (INDERAL LA) 60 MG 24 hr capsule    Sig: Take 1 capsule (60 mg total) by mouth daily.    Dispense:  90 capsule    Refill:  1   traZODone (DESYREL) 100 MG tablet    Sig: Take 1 tablet (100 mg total) by mouth at bedtime.    Dispense:  90 tablet    Refill:  1   Referral Orders  No referral(s) requested today      Note is dictated utilizing voice recognition software. Although note has been proof read prior to signing, occasional typographical errors still can be missed. If any questions arise, please do not hesitate to call  for verification.   electronically signed by:  Howard Pouch, DO  [place]

## 2022-10-12 ENCOUNTER — Ambulatory Visit
Admission: RE | Admit: 2022-10-12 | Discharge: 2022-10-12 | Disposition: A | Discharge status: Home / Self Care | Payer: Medicare Other | Source: Ambulatory Visit | Attending: Family Medicine | Admitting: Family Medicine

## 2022-10-12 DIAGNOSIS — Z1231 Encounter for screening mammogram for malignant neoplasm of breast: Secondary | ICD-10-CM | POA: Diagnosis not present

## 2022-11-06 DIAGNOSIS — Z5181 Encounter for therapeutic drug level monitoring: Secondary | ICD-10-CM | POA: Diagnosis not present

## 2022-11-06 DIAGNOSIS — M5136 Other intervertebral disc degeneration, lumbar region: Secondary | ICD-10-CM | POA: Diagnosis not present

## 2022-11-06 DIAGNOSIS — M5030 Other cervical disc degeneration, unspecified cervical region: Secondary | ICD-10-CM | POA: Diagnosis not present

## 2022-11-06 DIAGNOSIS — M47812 Spondylosis without myelopathy or radiculopathy, cervical region: Secondary | ICD-10-CM | POA: Diagnosis not present

## 2022-11-06 DIAGNOSIS — Z79899 Other long term (current) drug therapy: Secondary | ICD-10-CM | POA: Diagnosis not present

## 2022-11-06 DIAGNOSIS — M5412 Radiculopathy, cervical region: Secondary | ICD-10-CM | POA: Diagnosis not present

## 2022-11-13 NOTE — Progress Notes (Signed)
[facility] Quality Team Note  Name: Monica Schwartz Date of Birth: 1951-01-03 MRN: [id number] Date: 11/13/2022  [facility] Quality Team has reviewed this patient's chart, please see recommendations below:  [facility] Quality Other; ([facility] [place]- [facility]. Please offer FOBT / Cologuard Kit or Colonoscopy scheduling.)

## 2022-11-29 DIAGNOSIS — M5416 Radiculopathy, lumbar region: Secondary | ICD-10-CM | POA: Diagnosis not present

## 2022-12-12 ENCOUNTER — Other Ambulatory Visit: Payer: Self-pay | Admitting: Family Medicine

## 2022-12-12 DIAGNOSIS — F332 Major depressive disorder, recurrent severe without psychotic features: Secondary | ICD-10-CM

## 2022-12-12 DIAGNOSIS — F419 Anxiety disorder, unspecified: Secondary | ICD-10-CM

## 2022-12-25 DIAGNOSIS — M5412 Radiculopathy, cervical region: Secondary | ICD-10-CM | POA: Diagnosis not present

## 2023-02-14 ENCOUNTER — Encounter (INDEPENDENT_AMBULATORY_CARE_PROVIDER_SITE_OTHER): Payer: Self-pay

## 2023-02-15 ENCOUNTER — Other Ambulatory Visit: Payer: Self-pay | Admitting: Family Medicine

## 2023-03-19 ENCOUNTER — Encounter: Payer: Medicare Other | Admitting: Family Medicine

## 2023-03-29 ENCOUNTER — Ambulatory Visit: Payer: Medicare Other

## 2023-07-21 LAB — EXTERNAL GENERIC LAB PROCEDURE: COLOGUARD: NEGATIVE

## 2023-09-25 ENCOUNTER — Other Ambulatory Visit: Payer: Self-pay | Admitting: Family Medicine

## 2023-09-25 DIAGNOSIS — M1990 Unspecified osteoarthritis, unspecified site: Secondary | ICD-10-CM
